# Patient Record
Sex: Male | Born: 1965 | Race: White | Hispanic: No | Marital: Single | Smoking: Never smoker
Health system: Southern US, Community
[De-identification: ages and names within clinical notes are randomized; demographics above are authoritative.]

## PROBLEM LIST (undated history)

## (undated) DIAGNOSIS — E559 Vitamin D deficiency, unspecified: Secondary | ICD-10-CM

## (undated) DIAGNOSIS — I10 Essential (primary) hypertension: Secondary | ICD-10-CM

## (undated) DIAGNOSIS — E119 Type 2 diabetes mellitus without complications: Secondary | ICD-10-CM

## (undated) DIAGNOSIS — N200 Calculus of kidney: Secondary | ICD-10-CM

## (undated) DIAGNOSIS — Z87442 Personal history of urinary calculi: Secondary | ICD-10-CM

## (undated) HISTORY — PX: NO PAST SURGERIES: SHX2092

## (undated) HISTORY — PX: WISDOM TOOTH EXTRACTION: SHX21

---

## 2004-09-22 ENCOUNTER — Encounter: Payer: Self-pay | Admitting: Unknown Physician Specialty

## 2008-03-16 ENCOUNTER — Emergency Department: Payer: Self-pay | Admitting: Emergency Medicine

## 2013-06-08 ENCOUNTER — Emergency Department: Payer: Self-pay | Admitting: Emergency Medicine

## 2013-06-08 LAB — URINALYSIS, COMPLETE
Ketone: NEGATIVE
RBC,UR: 130 /HPF (ref 0–5)
Specific Gravity: 1.017 (ref 1.003–1.030)
Squamous Epithelial: NONE SEEN
WBC UR: 15 /HPF (ref 0–5)

## 2013-06-08 LAB — CBC
HCT: 42.6 % (ref 40.0–52.0)
MCH: 28 pg (ref 26.0–34.0)
MCHC: 32.3 g/dL (ref 32.0–36.0)
MCV: 87 fL (ref 80–100)
Platelet: 261 10*3/uL (ref 150–440)
RDW: 14.5 % (ref 11.5–14.5)

## 2013-06-08 LAB — BASIC METABOLIC PANEL
Anion Gap: 4 — ABNORMAL LOW (ref 7–16)
Calcium, Total: 9.4 mg/dL (ref 8.5–10.1)
Chloride: 103 mmol/L (ref 98–107)
Co2: 29 mmol/L (ref 21–32)
EGFR (Non-African Amer.): >60
Glucose: 189 mg/dL — ABNORMAL HIGH (ref 65–99)
Potassium: 4.5 mmol/L (ref 3.5–5.1)
Sodium: 136 mmol/L (ref 136–145)

## 2017-10-16 DIAGNOSIS — E559 Vitamin D deficiency, unspecified: Secondary | ICD-10-CM | POA: Insufficient documentation

## 2018-02-02 ENCOUNTER — Ambulatory Visit: Payer: BLUE CROSS/BLUE SHIELD | Admitting: Anesthesiology

## 2018-02-02 ENCOUNTER — Other Ambulatory Visit: Payer: Self-pay

## 2018-02-02 ENCOUNTER — Encounter: Payer: Self-pay | Admitting: *Deleted

## 2018-02-02 ENCOUNTER — Ambulatory Visit
Admission: RE | Admit: 2018-02-02 | Discharge: 2018-02-02 | Disposition: A | Discharge status: Home / Self Care | Payer: BLUE CROSS/BLUE SHIELD | Source: Ambulatory Visit | Attending: Unknown Physician Specialty | Admitting: Unknown Physician Specialty

## 2018-02-02 ENCOUNTER — Encounter
Admission: RE | Disposition: A | Discharge status: Home / Self Care | Payer: Self-pay | Source: Ambulatory Visit | Attending: Unknown Physician Specialty

## 2018-02-02 DIAGNOSIS — K648 Other hemorrhoids: Secondary | ICD-10-CM | POA: Insufficient documentation

## 2018-02-02 DIAGNOSIS — Z7984 Long term (current) use of oral hypoglycemic drugs: Secondary | ICD-10-CM | POA: Insufficient documentation

## 2018-02-02 DIAGNOSIS — D127 Benign neoplasm of rectosigmoid junction: Secondary | ICD-10-CM | POA: Insufficient documentation

## 2018-02-02 DIAGNOSIS — Z7982 Long term (current) use of aspirin: Secondary | ICD-10-CM | POA: Diagnosis not present

## 2018-02-02 DIAGNOSIS — Z1211 Encounter for screening for malignant neoplasm of colon: Secondary | ICD-10-CM | POA: Diagnosis present

## 2018-02-02 DIAGNOSIS — E119 Type 2 diabetes mellitus without complications: Secondary | ICD-10-CM | POA: Diagnosis not present

## 2018-02-02 DIAGNOSIS — Z79899 Other long term (current) drug therapy: Secondary | ICD-10-CM | POA: Insufficient documentation

## 2018-02-02 DIAGNOSIS — K635 Polyp of colon: Secondary | ICD-10-CM | POA: Insufficient documentation

## 2018-02-02 HISTORY — PX: COLONOSCOPY WITH PROPOFOL: SHX5780

## 2018-02-02 HISTORY — DX: Type 2 diabetes mellitus without complications: E11.9

## 2018-02-02 HISTORY — DX: Vitamin D deficiency, unspecified: E55.9

## 2018-02-02 HISTORY — DX: Personal history of urinary calculi: Z87.442

## 2018-02-02 HISTORY — DX: Calculus of kidney: N20.0

## 2018-02-02 LAB — GLUCOSE, CAPILLARY: Glucose-Capillary: 185 mg/dL — ABNORMAL HIGH (ref 65–99)

## 2018-02-02 SURGERY — COLONOSCOPY WITH PROPOFOL
Anesthesia: General

## 2018-02-02 MED ORDER — PROPOFOL 10 MG/ML IV BOLUS
INTRAVENOUS | Status: DC | PRN
  Administered 2018-02-02 (×2): 40 mg via INTRAVENOUS
  Administered 2018-02-02: 120 mg via INTRAVENOUS
  Administered 2018-02-02: 40 mg via INTRAVENOUS

## 2018-02-02 MED ORDER — SODIUM CHLORIDE 0.9 % IV SOLN: INTRAVENOUS | Status: DC

## 2018-02-02 MED ORDER — PROPOFOL 500 MG/50ML IV EMUL
INTRAVENOUS | Status: AC
Start: 2018-02-02 — End: 2018-02-02
  Filled 2018-02-02: qty 50

## 2018-02-02 MED ORDER — SODIUM CHLORIDE 0.9 % IV SOLN
INTRAVENOUS | Status: DC
  Administered 2018-02-02: 07:00:00 via INTRAVENOUS

## 2018-02-02 MED ORDER — SUCCINYLCHOLINE CHLORIDE 20 MG/ML IJ SOLN
INTRAMUSCULAR | Status: AC
  Filled 2018-02-02: qty 1

## 2018-02-02 MED ORDER — PROPOFOL 10 MG/ML IV BOLUS
INTRAVENOUS | Status: AC
  Filled 2018-02-02: qty 20

## 2018-02-02 MED ORDER — PHENYLEPHRINE HCL 10 MG/ML IJ SOLN
INTRAMUSCULAR | Status: AC
  Filled 2018-02-02: qty 1

## 2018-02-02 MED ORDER — PROPOFOL 500 MG/50ML IV EMUL
INTRAVENOUS | Status: DC | PRN
  Administered 2018-02-02: 140 ug/kg/min via INTRAVENOUS

## 2018-02-02 NOTE — H&P (Signed)
Primary Care Physician:  Sallee Lange, NP Primary Gastroenterologist:  Dr. Vira Agar  Pre-Procedure History & Physical: HPI:  Chris Kline is a 52 y.o. male is here for an colonoscopy for screening.   Past Medical History:  Diagnosis Date  . Diabetes mellitus without complication ([place])    type  . History of kidney stones   . Nephrolithiasis   . Vitamin D deficiency     Past Surgical History:  Procedure Laterality Date  . NO PAST SURGERIES    . WISDOM TOOTH EXTRACTION      Prior to Admission medications   Medication Sig Start Date End Date Taking? Authorizing Provider  acetaminophen (TYLENOL) 500 MG tablet Take 1,000 mg by mouth every 6 (six) hours as needed.   Yes [provider]  aspirin 81 MG chewable tablet Chew by mouth daily.   Yes [provider]  chlorpheniramine (CHLOR-TRIMETON) 4 MG tablet Take 4 mg by mouth as needed for allergies.   Yes [provider]  metFORMIN (GLUCOPHAGE) 500 MG tablet Take 500 mg by mouth 2 (two) times daily with a meal.   Yes [provider]  naproxen sodium (ALEVE) 220 MG tablet Take 440 mg by mouth 2 (two) times daily as needed.   Yes [provider]  Cholecalciferol 2000 units TABS Take 1 tablet by mouth daily.    [provider]    Allergies as of 02/01/2018 - Review Complete 02/01/2018  Allergen Reaction Noted  . Erythromycin Other (See Comments) 02/01/2018    History reviewed. No pertinent family history.  Social History   Socioeconomic History  . Marital status: Single    Spouse name: Not on file  . Number of children: Not on file  . Years of education: Not on file  . Highest education level: Not on file  Social Needs  . Financial resource strain: Not on file  . Food insecurity - worry: Not on file  . Food insecurity - inability: Not on file  . Transportation needs - medical: Not on file  . Transportation needs - non-medical: Not on file  Occupational  History  . Not on file  Tobacco Use  . Smoking status: Never Smoker  . Smokeless tobacco: Never Used  Substance and Sexual Activity  . Alcohol use: No    Frequency: Never  . Drug use: No  . Sexual activity: Not on file  Other Topics Concern  . Not on file  Social History Narrative  . Not on file    Review of Systems: See HPI, otherwise negative ROS  Physical Exam: BP 131/89   Pulse 82   Temp 97.7 F (36.5 C) (Tympanic)   Resp 16   Ht 6\' 1"  (1.854 m)   Wt 120.2 kg (265 lb)   SpO2 99%   BMI 34.96 kg/m  General:   Alert,  pleasant and cooperative in NAD Head:  Normocephalic and atraumatic. Neck:  Supple; no masses or thyromegaly. Lungs:  Clear throughout to auscultation.    Heart:  Regular rate and rhythm. Abdomen:  Soft, nontender and nondistended. Normal bowel sounds, without guarding, and without rebound.   Neurologic:  Alert and  oriented x4;  grossly normal neurologically.  Impression/Plan: THI SISEMORE is here for an colonoscopy to be performed for screening exam.  Risks, benefits, limitations, and alternatives regarding  colonoscopy have been reviewed with the patient.  Questions have been answered.  All parties agreeable.   Gaylyn Cheers, MD  02/02/2018, 7:30 AM

## 2018-02-02 NOTE — Transfer of Care (Signed)
Immediate Anesthesia Transfer of Care Note  Patient: Chris Kline  Procedure(s) Performed: COLONOSCOPY WITH PROPOFOL (N/A )  Patient Location: Endoscopy Unit  Anesthesia Type:General  Level of Consciousness: sedated  Airway & Oxygen Therapy: Patient Spontanous Breathing and Patient connected to nasal cannula oxygen  Post-op Assessment: Report given to RN and Post -op Vital signs reviewed and stable  Post vital signs: stable  Last Vitals:  Vitals:   02/02/18 0712 02/02/18 0801  BP: 131/89   Pulse: 82   Resp: 16   Temp: 36.5 C (!) 36.1 C  SpO2: 99%     Last Pain:  Vitals:   02/02/18 0801  TempSrc: Tympanic         Complications: No apparent anesthesia complications

## 2018-02-02 NOTE — Anesthesia Postprocedure Evaluation (Signed)
Anesthesia Post Note  Patient: Chris Kline  Procedure(s) Performed: COLONOSCOPY WITH PROPOFOL (N/A )  Patient location during evaluation: Endoscopy Anesthesia Type: General Level of consciousness: awake and alert Pain management: pain level controlled Vital Signs Assessment: post-procedure vital signs reviewed and stable Respiratory status: spontaneous breathing, nonlabored ventilation, respiratory function stable and patient connected to nasal cannula oxygen Cardiovascular status: blood pressure returned to baseline and stable Postop Assessment: no apparent nausea or vomiting Anesthetic complications: no     Last Vitals:  Vitals:   02/02/18 0811 02/02/18 0821  BP: 108/84 123/84  Pulse:    Resp:    Temp:    SpO2:      Last Pain:  Vitals:   02/02/18 0801  TempSrc: Tympanic  PainSc: Asleep                 Precious Haws Piscitello

## 2018-02-02 NOTE — Op Note (Signed)
[facility] Gastroenterology Patient Name: Chris Kline Procedure Date: 02/02/2018 7:33 AM MRN: [id number] Account #: [id number] Date of Birth: [birth year] Admit Type: Outpatient Age: 52 Room: [facility] ENDO ROOM 4 Gender: Male Note Status: Finalized Procedure:            Colonoscopy Indications:          Screening for colorectal malignant neoplasm Providers:            Manya Silvas, MD Referring MD:         Juluis Rainier (Referring MD) Medicines:            Propofol per Anesthesia Complications:        No immediate complications. Procedure:            Pre-Anesthesia Assessment:                       - After reviewing the risks and benefits, the patient                        was deemed in satisfactory condition to undergo the                        procedure.                       After obtaining informed consent, the colonoscope was                        passed under direct vision. Throughout the procedure,                        the patient's blood pressure, pulse, and oxygen                        saturations were monitored continuously. The                        Colonoscope was introduced through the anus and                        advanced to the the cecum, identified by appendiceal                        orifice and ileocecal valve. The colonoscopy was                        performed without difficulty. The patient tolerated the                        procedure well. The quality of the bowel preparation                        was excellent. Findings:      Two sessile polyps were found in the rectum. The polyps were diminutive       in size. These polyps were removed with a jumbo cold forceps. Resection       and retrieval were complete.      Internal hemorrhoids were found during endoscopy. The hemorrhoids were       medium-sized and Grade I (internal hemorrhoids that do not prolapse).      The exam  was otherwise without abnormality. Impression:            - Two diminutive polyps in the rectum, removed with a                        jumbo cold forceps. Resected and retrieved.                       - Internal hemorrhoids.                       - The examination was otherwise normal. Recommendation:       - Await pathology results. Manya Silvas, MD 02/02/2018 8:01:02 AM This report has been signed electronically. Number of Addenda: 0 Note Initiated On: 02/02/2018 7:33 AM Scope Withdrawal Time: 0 hours 11 minutes 9 seconds  Total Procedure Duration: 0 hours 17 minutes 55 seconds       [facility]

## 2018-02-02 NOTE — Anesthesia Post-op Follow-up Note (Signed)
Anesthesia QCDR form completed.        

## 2018-02-02 NOTE — Anesthesia Preprocedure Evaluation (Signed)
Anesthesia Evaluation  Patient identified by MRN, date of birth, ID band Patient awake    Reviewed: Allergy & Precautions, H&P , NPO status , Patient's Chart, lab work & pertinent test results  History of Anesthesia Complications Negative for: history of anesthetic complications  Airway Mallampati: III  TM Distance: <3 FB Neck ROM: full    Dental  (+) Chipped   Pulmonary neg shortness of breath,           Cardiovascular Exercise Tolerance: Good (-) angina(-) Past MI and (-) DOE negative cardio ROS       Neuro/Psych negative neurological ROS  negative psych ROS   GI/Hepatic negative GI ROS, Neg liver ROS, neg GERD  ,  Endo/Other  diabetes  Renal/GU Renal disease  negative genitourinary   Musculoskeletal   Abdominal   Peds  Hematology negative hematology ROS (+)   Anesthesia Other Findings Signs and symptoms suggestive of sleep apnea   Past Medical History: No date: Diabetes mellitus without complication ([facility])     Comment:  type No date: History of kidney stones No date: Nephrolithiasis No date: Vitamin D deficiency  Past Surgical History: No date: NO PAST SURGERIES No date: WISDOM TOOTH EXTRACTION  BMI    Body Mass Index:  34.96 kg/m      Reproductive/Obstetrics negative OB ROS                             Anesthesia Physical Anesthesia Plan  ASA: III  Anesthesia Plan: General   Post-op Pain Management:    Induction: Intravenous  PONV Risk Score and Plan: Propofol infusion and TIVA  Airway Management Planned: Natural Airway and Nasal Cannula  Additional Equipment:   Intra-op Plan:   Post-operative Plan:   Informed Consent: I have reviewed the patients History and Physical, chart, labs and discussed the procedure including the risks, benefits and alternatives for the proposed anesthesia with the patient or authorized representative who has indicated his/her  understanding and acceptance.   Dental Advisory Given  Plan Discussed with: Anesthesiologist, CRNA and Surgeon  Anesthesia Plan Comments: (Patient consented for risks of anesthesia including but not limited to:  - adverse reactions to medications - risk of intubation if required - damage to teeth, lips or other oral mucosa - sore throat or hoarseness - Damage to heart, brain, lungs or loss of life  Patient voiced understanding.)        Anesthesia Quick Evaluation

## 2018-02-03 LAB — SURGICAL PATHOLOGY

## 2018-02-04 ENCOUNTER — Encounter: Payer: Self-pay | Admitting: Unknown Physician Specialty

## 2018-02-28 ENCOUNTER — Emergency Department
Admission: EM | Admit: 2018-02-28 | Discharge: 2018-02-28 | Disposition: A | Discharge status: Home / Self Care | Payer: BLUE CROSS/BLUE SHIELD | Attending: Emergency Medicine | Admitting: Emergency Medicine

## 2018-02-28 ENCOUNTER — Encounter: Payer: Self-pay | Admitting: Emergency Medicine

## 2018-02-28 DIAGNOSIS — I10 Essential (primary) hypertension: Secondary | ICD-10-CM | POA: Diagnosis present

## 2018-02-28 DIAGNOSIS — Z7982 Long term (current) use of aspirin: Secondary | ICD-10-CM | POA: Diagnosis not present

## 2018-02-28 DIAGNOSIS — R253 Fasciculation: Secondary | ICD-10-CM | POA: Diagnosis not present

## 2018-02-28 DIAGNOSIS — M542 Cervicalgia: Secondary | ICD-10-CM | POA: Insufficient documentation

## 2018-02-28 DIAGNOSIS — Z7984 Long term (current) use of oral hypoglycemic drugs: Secondary | ICD-10-CM | POA: Diagnosis not present

## 2018-02-28 DIAGNOSIS — E119 Type 2 diabetes mellitus without complications: Secondary | ICD-10-CM | POA: Insufficient documentation

## 2018-02-28 HISTORY — DX: Essential (primary) hypertension: I10

## 2018-02-28 LAB — COMPREHENSIVE METABOLIC PANEL
ALK PHOS: 73 U/L (ref 38–126)
ALT: 38 U/L (ref 17–63)
AST: 37 U/L (ref 15–41)
Albumin: 4.6 g/dL (ref 3.5–5.0)
Anion gap: 9 (ref 5–15)
BUN: 10 mg/dL (ref 6–20)
CO2: 28 mmol/L (ref 22–32)
Calcium: 10 mg/dL (ref 8.9–10.3)
Chloride: 100 mmol/L — ABNORMAL LOW (ref 101–111)
Creatinine, Ser: 0.88 mg/dL (ref 0.61–1.24)
GFR calc Af Amer: >60 mL/min (ref >60)
Glucose, Bld: 136 mg/dL — ABNORMAL HIGH (ref 65–99)
Potassium: 5 mmol/L (ref 3.5–5.1)
Sodium: 137 mmol/L (ref 135–145)
Total Bilirubin: 0.6 mg/dL (ref 0.3–1.2)
Total Protein: 8.5 g/dL — ABNORMAL HIGH (ref 6.5–8.1)

## 2018-02-28 LAB — CBC WITH DIFFERENTIAL/PLATELET
BASOS ABS: 0 10*3/uL (ref 0–0.1)
Basophils Relative: 0 %
EOS ABS: 0.2 10*3/uL (ref 0–0.7)
EOS PCT: 1 %
HCT: 46.6 % (ref 40.0–52.0)
Hemoglobin: 16 g/dL (ref 13.0–18.0)
Lymphocytes Relative: 22 %
Lymphs Abs: 2.3 10*3/uL (ref 1.0–3.6)
MCH: 28.8 pg (ref 26.0–34.0)
MCHC: 34.3 g/dL (ref 32.0–36.0)
MCV: 84 fL (ref 80.0–100.0)
Monocytes Absolute: 0.8 10*3/uL (ref 0.2–1.0)
Monocytes Relative: 8 %
Neutro Abs: 7.1 10*3/uL — ABNORMAL HIGH (ref 1.4–6.5)
Neutrophils Relative %: 69 %
PLATELETS: 304 10*3/uL (ref 150–440)
RBC: 5.55 MIL/uL (ref 4.40–5.90)
RDW: 14 % (ref 11.5–14.5)
WBC: 10.4 10*3/uL (ref 3.8–10.6)

## 2018-02-28 LAB — TROPONIN I

## 2018-02-28 LAB — TSH: TSH: 2.992 u[IU]/mL (ref 0.350–4.500)

## 2018-02-28 NOTE — ED Triage Notes (Signed)
Pt reports his MD just put him on a blood pressure medication at the end of April. Pt takes 5mg  of lisinopril. Pt reports he could not get his md to answer the office so he came here. Pt reports the bottom number has been high for the past 4 days and he has been hyper. Denies SOB, other sx's. Pt also reports she increased his dose of diabetes medication and he thinks maybe that is making him hyper.

## 2018-02-28 NOTE — ED Notes (Signed)
VS rechecked.  BP 159/92 large cuff sitting.  Wait explained to patient.

## 2018-02-28 NOTE — Discharge Instructions (Signed)
Follow-up with your regular doctor if not better in 3 days.  Continue to take your medications as prescribed.

## 2018-02-28 NOTE — ED Notes (Signed)
See triage note  States he was recently placed on a larger dose of Metformin and  Lisinopril   States over the past 4 days he has woke up shaky and felt like his heart was beating in his head

## 2018-02-28 NOTE — ED Provider Notes (Signed)
[facility] Emergency Department Provider Note  ____________________________________________   First MD Initiated Contact with Patient 02/28/18 1345     (approximate)  I have reviewed the triage vital signs and the nursing notes.   HISTORY  Chief Complaint Hypertension    HPI Chris Kline is a 52 y.o. male presents emergency department with concerns of having a heart attack or stroke.  He states he was recently placed on a blood pressure medicine.  Lisinopril 5 mg daily.  He also had been started on metformin 500 mg twice daily.  His doctor recently increased his dose of metformin to 1000 mg twice daily.  He states the last metformin he took did not have HCL behind it.  He states he is waking up and unable to get back to sleep.  He feels hyper.  Jittery.  He has had some posterior neck pain with radiation into the left arm.  He states his family history is strong for MIs and strokes and this got him highly concerned.  He denies chest pain at this time.  He denies shortness of breath.  He denies any diaphoresis.  He states the pain is not there anymore.  Past Medical History:  Diagnosis Date  . Diabetes mellitus without complication ([place])    type  . History of kidney stones   . Hypertension   . Nephrolithiasis   . Vitamin D deficiency     There are no active problems to display for this patient.   Past Surgical History:  Procedure Laterality Date  . COLONOSCOPY WITH PROPOFOL N/A 02/02/2018   Procedure: COLONOSCOPY WITH PROPOFOL;  Surgeon: Manya Silvas, MD;  Location: [facility] ENDOSCOPY;  Service: Endoscopy;  Laterality: N/A;  . NO PAST SURGERIES    . WISDOM TOOTH EXTRACTION      Prior to Admission medications   Medication Sig Start Date End Date Taking? Authorizing Provider  acetaminophen (TYLENOL) 500 MG tablet Take 1,000 mg by mouth every 6 (six) hours as needed.    [provider]  aspirin 81 MG chewable tablet Chew by mouth daily.     [provider]  chlorpheniramine (CHLOR-TRIMETON) 4 MG tablet Take 4 mg by mouth as needed for allergies.    [provider]  Cholecalciferol 2000 units TABS Take 1 tablet by mouth daily.    [provider]  metFORMIN (GLUCOPHAGE) 500 MG tablet Take 500 mg by mouth 2 (two) times daily with a meal.    [provider]  naproxen sodium (ALEVE) 220 MG tablet Take 440 mg by mouth 2 (two) times daily as needed.    [provider]    Allergies Erythromycin  No family history on file.  Social History Social History   Tobacco Use  . Smoking status: Never Smoker  . Smokeless tobacco: Never Used  Substance Use Topics  . Alcohol use: No    Frequency: Never  . Drug use: No    Review of Systems  Constitutional: No fever/chills, states that he feels hyper and jittery Eyes: No visual changes. ENT: No sore throat. Respiratory: Denies cough Cardiovascular: Denies chest pain Genitourinary: Negative for dysuria. Musculoskeletal: Negative for back pain.  Positive for left arm pain Skin: Negative for rash.    ____________________________________________   PHYSICAL EXAM:  VITAL SIGNS: ED Triage Vitals  Enc Vitals Group     BP 02/28/18 0930 (!) 178/101     Pulse Rate 02/28/18 0930 89     Resp 02/28/18 0930 20  Temp 02/28/18 0930 98.1 F (36.7 C)     Temp Source 02/28/18 0930 Oral     SpO2 02/28/18 0930 100 %     Weight 02/28/18 0931 265 lb (120.2 kg)     Height 02/28/18 0931 6' (1.829 m)     Head Circumference --      Peak Flow --      Pain Score 02/28/18 0931 0     Pain Loc --      Pain Edu? --      Excl. in [place]? --     Constitutional: Alert and oriented. Well appearing and in no acute distress. Eyes: Conjunctivae are normal.  Head: Atraumatic. Nose: No congestion/rhinnorhea. Mouth/Throat: Mucous membranes are moist.   Neck: Is supple, no lymphadenopathy is noted, C-spine is nontender Cardiovascular: Normal rate, regular  rhythm.  Heart sounds are normal, no murmur rub or gallop is noted.  No bruits are noted Respiratory: Normal respiratory effort.  No retractions lungs are clear to auscultation GU: deferred Musculoskeletal: FROM all extremities, warm and well perfused Neurologic:  Normal speech and language.  Skin:  Skin is warm, dry and intact. No rash noted. Psychiatric: Mood and affect are normal. Speech and behavior are normal.  ____________________________________________   LABS (all labs ordered are listed, but only abnormal results are displayed)  Labs Reviewed  COMPREHENSIVE METABOLIC PANEL - Abnormal; Notable for the following components:      Result Value   Chloride 100 (*)    Glucose, Bld 136 (*)    Total Protein 8.5 (*)    All other components within normal limits  CBC WITH DIFFERENTIAL/PLATELET - Abnormal; Notable for the following components:   Neutro Abs 7.1 (*)    All other components within normal limits  TROPONIN I  TSH   ____________________________________________   ____________________________________________  RADIOLOGY    ____________________________________________   PROCEDURES  Procedure(s) performed: EKG  Procedures    ____________________________________________   INITIAL IMPRESSION / ASSESSMENT AND PLAN / ED COURSE  Pertinent labs & imaging results that were available during my care of the patient were reviewed by me and considered in my medical decision making (see chart for details).  Patient is a 52 year old male presents emergency department with concerns of suddenly becoming jittery with some posterior neck pain and left arm pain.  He is concerned he is having an MI or stroke.  Denies any slurred speech or stroke symptoms.  He has a strong family history of the above.  On physical exam he appears very well.  Lungs are clear to auscultation heart sounds are normal EKG is normal sinus rhythm with no STEMI noted.  Labs were obtained.  Lab results  show negative troponin, CBC and met C are also normal.  Test results were explained to the patient.  Explained that it could be the binding component and the new dose of metformin that is making him feel odd.  He should follow-up with his regular doctor to discuss this medication.  He may need a increase in his lisinopril as his blood pressure is still elevated here in the ED.  He states he understands he will follow-up with his doctor.  He had called her earlier today and was unable to get through with their phones were down.  He was discharged in stable condition     As part of my medical decision making, I reviewed the following data within the [place] notes reviewed and incorporated, Labs reviewed as noted above  they are normal, EKG interpreted NSR, EKG was read by Dr. Clearnce Hasten and states no STEMI, Old chart reviewed, Notes from prior ED visits and [place] Controlled Substance Database  ____________________________________________   FINAL CLINICAL IMPRESSION(S) / ED DIAGNOSES  Final diagnoses:  Essential hypertension  Muscle twitching      NEW MEDICATIONS STARTED DURING THIS VISIT:  Discharge Medication List as of 02/28/2018  3:27 PM       Note:  This document was prepared using Dragon voice recognition software and may include unintentional dictation errors.    Versie Starks, PA-C 02/28/18 1849    Clearnce Hasten Randall An, MD 03/01/18 [phone]

## 2018-02-28 NOTE — ED Triage Notes (Signed)
Pt reports the phones were down in [place] and he was not able to reach his doctor.

## 2019-03-02 DIAGNOSIS — Z5320 Procedure and treatment not carried out because of patient's decision for unspecified reasons: Secondary | ICD-10-CM | POA: Insufficient documentation

## 2019-03-02 DIAGNOSIS — Z5329 Procedure and treatment not carried out because of patient's decision for other reasons: Secondary | ICD-10-CM | POA: Insufficient documentation

## 2020-08-27 ENCOUNTER — Encounter: Payer: Self-pay | Admitting: Podiatry

## 2020-08-27 ENCOUNTER — Emergency Department: Payer: BC Managed Care – PPO

## 2020-08-27 ENCOUNTER — Inpatient Hospital Stay
Admission: EM | Admit: 2020-08-27 | Discharge: 2020-08-30 | DRG: 872 | Disposition: A | Discharge status: Home Health | Payer: BC Managed Care – PPO | Attending: Internal Medicine | Admitting: Internal Medicine

## 2020-08-27 ENCOUNTER — Other Ambulatory Visit: Payer: Self-pay

## 2020-08-27 ENCOUNTER — Other Ambulatory Visit
Admission: RE | Admit: 2020-08-27 | Discharge: 2020-08-27 | Disposition: A | Discharge status: Home / Self Care | Payer: BC Managed Care – PPO | Source: Ambulatory Visit | Attending: Podiatry | Admitting: Podiatry

## 2020-08-27 ENCOUNTER — Inpatient Hospital Stay: Payer: BC Managed Care – PPO

## 2020-08-27 DIAGNOSIS — Z881 Allergy status to other antibiotic agents status: Secondary | ICD-10-CM | POA: Diagnosis not present

## 2020-08-27 DIAGNOSIS — E11621 Type 2 diabetes mellitus with foot ulcer: Secondary | ICD-10-CM | POA: Diagnosis present

## 2020-08-27 DIAGNOSIS — E1165 Type 2 diabetes mellitus with hyperglycemia: Secondary | ICD-10-CM | POA: Diagnosis present

## 2020-08-27 DIAGNOSIS — N179 Acute kidney failure, unspecified: Secondary | ICD-10-CM | POA: Diagnosis present

## 2020-08-27 DIAGNOSIS — Z7984 Long term (current) use of oral hypoglycemic drugs: Secondary | ICD-10-CM | POA: Diagnosis not present

## 2020-08-27 DIAGNOSIS — Z79899 Other long term (current) drug therapy: Secondary | ICD-10-CM

## 2020-08-27 DIAGNOSIS — R739 Hyperglycemia, unspecified: Secondary | ICD-10-CM

## 2020-08-27 DIAGNOSIS — L97529 Non-pressure chronic ulcer of other part of left foot with unspecified severity: Secondary | ICD-10-CM | POA: Diagnosis present

## 2020-08-27 DIAGNOSIS — L089 Local infection of the skin and subcutaneous tissue, unspecified: Secondary | ICD-10-CM | POA: Diagnosis not present

## 2020-08-27 DIAGNOSIS — I10 Essential (primary) hypertension: Secondary | ICD-10-CM | POA: Diagnosis present

## 2020-08-27 DIAGNOSIS — L02612 Cutaneous abscess of left foot: Secondary | ICD-10-CM | POA: Diagnosis present

## 2020-08-27 DIAGNOSIS — Z833 Family history of diabetes mellitus: Secondary | ICD-10-CM

## 2020-08-27 DIAGNOSIS — E119 Type 2 diabetes mellitus without complications: Secondary | ICD-10-CM | POA: Diagnosis not present

## 2020-08-27 DIAGNOSIS — E872 Acidosis: Secondary | ICD-10-CM | POA: Diagnosis present

## 2020-08-27 DIAGNOSIS — A419 Sepsis, unspecified organism: Secondary | ICD-10-CM | POA: Diagnosis present

## 2020-08-27 DIAGNOSIS — M009 Pyogenic arthritis, unspecified: Secondary | ICD-10-CM | POA: Diagnosis present

## 2020-08-27 DIAGNOSIS — Z8249 Family history of ischemic heart disease and other diseases of the circulatory system: Secondary | ICD-10-CM | POA: Diagnosis not present

## 2020-08-27 DIAGNOSIS — Z20822 Contact with and (suspected) exposure to covid-19: Secondary | ICD-10-CM | POA: Diagnosis present

## 2020-08-27 DIAGNOSIS — Z7982 Long term (current) use of aspirin: Secondary | ICD-10-CM | POA: Diagnosis not present

## 2020-08-27 DIAGNOSIS — Z87442 Personal history of urinary calculi: Secondary | ICD-10-CM | POA: Diagnosis not present

## 2020-08-27 DIAGNOSIS — L97509 Non-pressure chronic ulcer of other part of unspecified foot with unspecified severity: Secondary | ICD-10-CM | POA: Diagnosis present

## 2020-08-27 DIAGNOSIS — M795 Residual foreign body in soft tissue: Secondary | ICD-10-CM

## 2020-08-27 LAB — CBC WITH DIFFERENTIAL/PLATELET
Abs Immature Granulocytes: 0.04 10*3/uL (ref 0.00–0.07)
Basophils Absolute: 0 10*3/uL (ref 0.0–0.1)
Basophils Relative: 0 %
Eosinophils Absolute: 0.1 10*3/uL (ref 0.0–0.5)
Eosinophils Relative: 1 %
HCT: 40 % (ref 39.0–52.0)
Hemoglobin: 13.5 g/dL (ref 13.0–17.0)
Immature Granulocytes: 0 %
Lymphocytes Relative: 11 %
Lymphs Abs: 1.4 10*3/uL (ref 0.7–4.0)
MCH: 29.2 pg (ref 26.0–34.0)
MCHC: 33.8 g/dL (ref 30.0–36.0)
MCV: 86.4 fL (ref 80.0–100.0)
Monocytes Absolute: 0.9 10*3/uL (ref 0.1–1.0)
Monocytes Relative: 7 %
Neutro Abs: 10.7 10*3/uL — ABNORMAL HIGH (ref 1.7–7.7)
Neutrophils Relative %: 81 %
Platelets: 281 10*3/uL (ref 150–400)
RBC: 4.63 MIL/uL (ref 4.22–5.81)
RDW: 12.7 % (ref 11.5–15.5)
WBC: 13.2 10*3/uL — ABNORMAL HIGH (ref 4.0–10.5)
nRBC: 0 % (ref 0.0–0.2)

## 2020-08-27 LAB — COMPREHENSIVE METABOLIC PANEL
ALT: 20 U/L (ref 0–44)
AST: 17 U/L (ref 15–41)
Albumin: 4.3 g/dL (ref 3.5–5.0)
Alkaline Phosphatase: 49 U/L (ref 38–126)
Anion gap: 13 (ref 5–15)
BUN: 23 mg/dL — ABNORMAL HIGH (ref 6–20)
CO2: 21 mmol/L — ABNORMAL LOW (ref 22–32)
Calcium: 9.6 mg/dL (ref 8.9–10.3)
Chloride: 99 mmol/L (ref 98–111)
Creatinine, Ser: 1.48 mg/dL — ABNORMAL HIGH (ref 0.61–1.24)
GFR, Estimated: 53 mL/min — ABNORMAL LOW (ref >60)
Glucose, Bld: 232 mg/dL — ABNORMAL HIGH (ref 70–99)
Potassium: 4.5 mmol/L (ref 3.5–5.1)
Sodium: 133 mmol/L — ABNORMAL LOW (ref 135–145)
Total Bilirubin: 1 mg/dL (ref 0.3–1.2)
Total Protein: 8.5 g/dL — ABNORMAL HIGH (ref 6.5–8.1)

## 2020-08-27 LAB — HEMOGLOBIN A1C
Hgb A1c MFr Bld: 6.7 % — ABNORMAL HIGH (ref 4.8–5.6)
Mean Plasma Glucose: 145.59 mg/dL

## 2020-08-27 LAB — LACTIC ACID, PLASMA
Lactic Acid, Venous: 2.8 mmol/L (ref 0.5–1.9)
Lactic Acid, Venous: 3 mmol/L (ref 0.5–1.9)
Lactic Acid, Venous: 2.7 mmol/L (ref 0.5–1.9)

## 2020-08-27 LAB — AEROBIC CULTURE  (SUPERFICIAL SPECIMEN)

## 2020-08-27 LAB — PROTIME-INR
INR: 1.1 (ref 0.8–1.2)
Prothrombin Time: 13.8 seconds (ref 11.4–15.2)

## 2020-08-27 LAB — PROCALCITONIN: Procalcitonin: <0.1 ng/mL

## 2020-08-27 LAB — HIV ANTIBODY (ROUTINE TESTING W REFLEX): HIV Screen 4th Generation wRfx: NONREACTIVE

## 2020-08-27 LAB — SEDIMENTATION RATE: Sed Rate: 58 mm/hr — ABNORMAL HIGH (ref 0–20)

## 2020-08-27 LAB — C-REACTIVE PROTEIN: CRP: 17.8 mg/dL — ABNORMAL HIGH (ref <1.0)

## 2020-08-27 LAB — RESPIRATORY PANEL BY RT PCR (FLU A&B, COVID)
Influenza A by PCR: NEGATIVE
Influenza B by PCR: NEGATIVE
SARS Coronavirus 2 by RT PCR: NEGATIVE

## 2020-08-27 LAB — GLUCOSE, CAPILLARY
Glucose-Capillary: 149 mg/dL — ABNORMAL HIGH (ref 70–99)
Glucose-Capillary: 160 mg/dL — ABNORMAL HIGH (ref 70–99)

## 2020-08-27 LAB — APTT: aPTT: 27 seconds (ref 24–36)

## 2020-08-27 MED ORDER — HEPARIN SODIUM (PORCINE) 5000 UNIT/ML IJ SOLN
5000.0000 [IU] | Freq: Three times a day (TID) | INTRAMUSCULAR | Status: DC
  Administered 2020-08-27 – 2020-08-29 (×5): 5000 [IU] via SUBCUTANEOUS
  Filled 2020-08-27 (×5): qty 1

## 2020-08-27 MED ORDER — SODIUM CHLORIDE 0.9 % IV BOLUS: 3000.0000 mL | Freq: Once | INTRAVENOUS | Status: DC

## 2020-08-27 MED ORDER — SODIUM CHLORIDE 0.9 % IV SOLN: INTRAVENOUS | Status: DC

## 2020-08-27 MED ORDER — INSULIN ASPART 100 UNIT/ML ~~LOC~~ SOLN
0.0000 [IU] | Freq: Every day | SUBCUTANEOUS | Status: DC
  Administered 2020-08-28: 3 [IU] via SUBCUTANEOUS
  Administered 2020-08-29: 2 [IU] via SUBCUTANEOUS
  Filled 2020-08-27 (×2): qty 1

## 2020-08-27 MED ORDER — ACETAMINOPHEN 325 MG PO TABS: 650.0000 mg | ORAL_TABLET | Freq: Four times a day (QID) | ORAL | Status: DC | PRN

## 2020-08-27 MED ORDER — VANCOMYCIN HCL IN DEXTROSE 1-5 GM/200ML-% IV SOLN
1000.0000 mg | Freq: Two times a day (BID) | INTRAVENOUS | Status: DC
  Administered 2020-08-28: 1000 mg via INTRAVENOUS
  Filled 2020-08-27 (×2): qty 200

## 2020-08-27 MED ORDER — HYDRALAZINE HCL 20 MG/ML IJ SOLN: 5.0000 mg | INTRAMUSCULAR | Status: DC | PRN

## 2020-08-27 MED ORDER — SODIUM CHLORIDE 0.9 % IV BOLUS
1000.0000 mL | Freq: Once | INTRAVENOUS | Status: AC
  Administered 2020-08-27: 1000 mL via INTRAVENOUS

## 2020-08-27 MED ORDER — INSULIN ASPART 100 UNIT/ML ~~LOC~~ SOLN
0.0000 [IU] | Freq: Three times a day (TID) | SUBCUTANEOUS | Status: DC
  Administered 2020-08-29: 3 [IU] via SUBCUTANEOUS
  Administered 2020-08-29: 2 [IU] via SUBCUTANEOUS
  Administered 2020-08-29: 3 [IU] via SUBCUTANEOUS
  Administered 2020-08-30: 2 [IU] via SUBCUTANEOUS
  Filled 2020-08-27 (×5): qty 1

## 2020-08-27 MED ORDER — SODIUM CHLORIDE 0.9 % IV SOLN
2.0000 g | INTRAVENOUS | Status: DC
  Administered 2020-08-28 – 2020-08-29 (×2): 2 g via INTRAVENOUS
  Filled 2020-08-27 (×3): qty 20

## 2020-08-27 MED ORDER — VANCOMYCIN HCL IN DEXTROSE 1-5 GM/200ML-% IV SOLN: 1000.0000 mg | Freq: Once | INTRAVENOUS | Status: DC

## 2020-08-27 MED ORDER — VANCOMYCIN HCL IN DEXTROSE 1-5 GM/200ML-% IV SOLN
1000.0000 mg | Freq: Once | INTRAVENOUS | Status: AC
  Administered 2020-08-27: 1000 mg via INTRAVENOUS
  Filled 2020-08-27: qty 200

## 2020-08-27 MED ORDER — SODIUM CHLORIDE 0.9 % IV SOLN: 1.0000 g | INTRAVENOUS | Status: DC

## 2020-08-27 MED ORDER — ONDANSETRON HCL 4 MG/2ML IJ SOLN: 4.0000 mg | Freq: Three times a day (TID) | INTRAMUSCULAR | Status: DC | PRN

## 2020-08-27 MED ORDER — SENNOSIDES-DOCUSATE SODIUM 8.6-50 MG PO TABS: 1.0000 | ORAL_TABLET | Freq: Every evening | ORAL | Status: DC | PRN

## 2020-08-27 MED ORDER — SODIUM CHLORIDE 0.9 % IV SOLN
2.0000 g | Freq: Once | INTRAVENOUS | Status: AC
  Administered 2020-08-27: 2 g via INTRAVENOUS
  Filled 2020-08-27: qty 20

## 2020-08-27 MED ORDER — VANCOMYCIN HCL 1500 MG/300ML IV SOLN
1500.0000 mg | Freq: Once | INTRAVENOUS | Status: AC
  Administered 2020-08-27: 1500 mg via INTRAVENOUS
  Filled 2020-08-27 (×2): qty 300

## 2020-08-27 MED ORDER — GADOBUTROL 1 MMOL/ML IV SOLN
10.0000 mL | Freq: Once | INTRAVENOUS | Status: AC | PRN
  Administered 2020-08-27: 10 mL via INTRAVENOUS

## 2020-08-27 MED ORDER — VITAMIN D 25 MCG (1000 UNIT) PO TABS
2000.0000 [IU] | ORAL_TABLET | Freq: Every day | ORAL | Status: DC
  Administered 2020-08-29 – 2020-08-30 (×2): 2000 [IU] via ORAL
  Filled 2020-08-27 (×2): qty 2

## 2020-08-27 MED ORDER — INSULIN ASPART 100 UNIT/ML ~~LOC~~ SOLN
0.0000 [IU] | SUBCUTANEOUS | Status: DC
  Filled 2020-08-27: qty 1

## 2020-08-27 MED ORDER — LACTATED RINGERS IV BOLUS
30.0000 mL/kg | Freq: Once | INTRAVENOUS | Status: AC
  Administered 2020-08-27: 2328 mL via INTRAVENOUS

## 2020-08-27 NOTE — ED Notes (Signed)
Report called to nicole rn floor nurse

## 2020-08-27 NOTE — Consult Note (Addendum)
[facility] Nurse Consult Note: Reason for Consult: Consult requested for left foot wound.  Performed remotely after review of the progress notes and photo in the EMR.  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 Dressing procedure/placement/frequency: Topical treatment orders provided for bedside nurses to perform as follows until further recommendations are available from the podiatry team: Apply moist fluffed gauze to left foot wound Q day, then cover with foam dressing.   Podiatry consult has been requested and is pending.  MRI has been ordered.  Please refer to podiatry team for further plan of care.  Please re-consult if further assistance is needed.  Thank-you,  Julien Girt MSN, [place], [place], [place], [place]

## 2020-08-27 NOTE — Consult Note (Signed)
ORTHOPAEDIC CONSULTATION  REQUESTING PHYSICIAN: Ivor Costa, MD  Chief Complaint: Left foot infection  HPI: Chris Kline is a 54 y.o. male who complains of worsening left foot infection.  2 to 3 weeks ago he was working in his yard and some gravel was called in his boot.  He developed a blister under his left great toe joint.  He has noticed worsening redness and swelling.  Was seen in the outpatient clinic today by Dr. Sharlotte Alamo.  The wound probed with obvious purulent drainage.  He had noted surrounding redness with a lymphangitic streak into his foot.  Admitted to the hospital for IV antibiotics and likely debridement of abscess.  Past Medical History:  Diagnosis Date  . Diabetes mellitus without complication ([place])    type  . History of kidney stones   . Hypertension   . Nephrolithiasis   . Vitamin D deficiency    Past Surgical History:  Procedure Laterality Date  . COLONOSCOPY WITH PROPOFOL N/A 02/02/2018   Procedure: COLONOSCOPY WITH PROPOFOL;  Surgeon: Manya Silvas, MD;  Location: [facility] ENDOSCOPY;  Service: Endoscopy;  Laterality: N/A;  . NO PAST SURGERIES    . WISDOM TOOTH EXTRACTION     Social History   Socioeconomic History  . Marital status: Single    Spouse name: Not on file  . Number of children: Not on file  . Years of education: Not on file  . Highest education level: Not on file  Occupational History  . Not on file  Tobacco Use  . Smoking status: Never Smoker  . Smokeless tobacco: Never Used  Vaping Use  . Vaping Use: Never used  Substance and Sexual Activity  . Alcohol use: No  . Drug use: No  . Sexual activity: Not on file  Other Topics Concern  . Not on file  Social History Narrative  . Not on file   Social Determinants of Health   Financial Resource Strain:   . Difficulty of Paying Living Expenses: Not on file  Food Insecurity:   . Worried About Charity fundraiser in the Last Year: Not on file  . Ran Out of Food in the Last Year:  Not on file  Transportation Needs:   . Lack of Transportation (Medical): Not on file  . Lack of Transportation (Non-Medical): Not on file  Physical Activity:   . Days of Exercise per Week: Not on file  . Minutes of Exercise per Session: Not on file  Stress:   . Feeling of Stress : Not on file  Social Connections:   . Frequency of Communication with Friends and Family: Not on file  . Frequency of Social Gatherings with Friends and Family: Not on file  . Attends Religious Services: Not on file  . Active Member of Clubs or Organizations: Not on file  . Attends Archivist Meetings: Not on file  . Marital Status: Not on file   Family History  Problem Relation Age of Onset  . Heart attack Mother   . Diabetes Mellitus II Father   . Stroke Father   . Heart attack Father   . Lung cancer Sister    Allergies  Allergen Reactions  . Erythromycin Other (See Comments)    Abdominal pain   Prior to Admission medications   Medication Sig Start Date End Date Taking? Authorizing Provider  Cholecalciferol 2000 units TABS Take 1 tablet by mouth daily.   Yes [provider]  lisinopril-hydrochlorothiazide (ZESTORETIC) 20-25 MG tablet Take  1 tablet by mouth daily. 08/12/20  Yes [provider]  metFORMIN (GLUCOPHAGE) 500 MG tablet Take 500 mg by mouth 2 (two) times daily with a meal.   Yes [provider]   DG Eye Foreign Body  Result Date: 08/27/2020 CLINICAL DATA:  Metal working/exposure; clearance prior to MRI EXAM: ORBITS FOR FOREIGN BODY - 2 VIEW COMPARISON:  None. FINDINGS: There is no evidence of metallic foreign body within the orbits. No significant bone abnormality identified. IMPRESSION: No evidence of metallic foreign body within the orbits. Electronically Signed   By: Franchot Gallo M.D.   On: 08/27/2020 13:24   MR FOOT LEFT W WO CONTRAST  Result Date: 08/27/2020 CLINICAL DATA:  Diabetic with foot swelling and enlarging plantar foot wound for 3  weeks. The wound was drained in the clinic. Clinical concern for osteomyelitis. EXAM: MRI OF THE LEFT FOREFOOT WITHOUT AND WITH CONTRAST TECHNIQUE: Multiplanar, multisequence MR imaging of the left forefoot was performed both before and after administration of intravenous contrast. CONTRAST:  78mL GADAVIST GADOBUTROL 1 MMOL/ML IV SOLN COMPARISON:  None. FINDINGS: Despite efforts by the technologist and patient, mild motion artifact is present on today's exam and could not be eliminated. This reduces exam sensitivity and specificity. Bones/Joint/Cartilage No evidence of acute fracture, dislocation or osteomyelitis. Specifically, no suspicious marrow signal, abnormal enhancement or cortical destruction. Mild degenerative changes within the sesamoids of the 1st metatarsal. No significant joint effusions or suspicious synovial enhancement. Ligaments The Lisfranc ligament is intact. The collateral ligaments of the metatarsophalangeal joints are intact. Muscles and Tendons Intact forefoot tendons without significant tenosynovitis. Diffuse T2 hyperintensity and low level enhancement throughout the forefoot musculature consistent with diabetic myopathy. No focal intramuscular fluid collections. Soft tissues Focal skin ulceration plantar to the 1st metatarsophalangeal joint consistent with the patient's ulcer. There is an underlying heterogeneous fluid collection or phlegmon which abuts the sesamoids, measuring 2.8 x 2.6 x 1.6 cm. There is generalized poor enhancement of the subcutaneous fat in the ball of the foot. No other focal fluid collections are seen. No evidence of soft tissue emphysema or foreign body. There is a small pressure lesion plantar to the 5th MTP joint. IMPRESSION: 1. Focal skin ulceration plantar to the 1st metatarsophalangeal joint consistent with the patient's ulcer. There is an underlying small abscess or phlegmon which abuts the sesamoids, measuring 2.8 x 2.6 x 1.6 cm. 2. No evidence of osteomyelitis  or septic joint. 3. Diffuse poor enhancement of the subcutaneous fat in the ball of the foot consistent with poor perfusion and possible devitalized tissue. Electronically Signed   By: Richardean Sale M.D.   On: 08/27/2020 14:40    Positive ROS: All other systems have been reviewed and were otherwise negative with the exception of those mentioned in the HPI and as above.  12 point ROS was performed.  Physical Exam: General: Alert and oriented.  No apparent distress.  Vascular:  Left foot:Dorsalis Pedis:  present Posterior Tibial:  present  Right foot: Dorsalis Pedis:  present Posterior Tibial:  present  Neuro:absent protective sensation  Derm: Right foot without ulceration  Left foot with noted ulceration to the plantar aspect of the left first MTPJ.  There is surrounding erythema.  Scant purulent drainage.  This probes directly down to bone and capsule at this time.  Noted edema to the foot as well.  Ortho/MS: Edema to the left foot.  Assessment: Abscess plantar left foot Diabetic neuropathy  Plan: MRI is negative for osteomyelitis.  At this point  we will plan on incision and drainage with debridement of all infected soft tissue.  The abscessed area does abut the sesamoid region and we will evaluate the sesamoids.  If there is any obvious changes to the sesamoid will likely remove the problematic area at that time.  I did discuss this with the patient in detail.  He was relieved that there is no bone infection.   We will plan for debridement tomorrow.  A deep wound culture has been performed in the outpatient clinic.  Blood cultures have been performed now.  Orders placed for surgery tomorrow.  Plan for tomorrow afternoon.    Elesa Hacker, DPM Cell [phone]   08/27/2020 5:30 PM

## 2020-08-27 NOTE — ED Provider Notes (Signed)
[facility] Emergency Department Provider Note  ____________________________________________   First MD Initiated Contact with Patient 08/27/20 1151     (approximate)  I have reviewed the triage vital signs and the nursing notes.   HISTORY  Chief Complaint Wound Infection   HPI Chris Kline is a 54 y.o. male with a past medical history of type 2 diabetes currently insulin-dependent, HTN, nephrolithiasis, vitamin D deficiency who presents for assessment after being referred to the ED by his podiatrist for further evaluation of a wound to the bottom of his left foot. Patient states he has had a blister under his left foot proximal to the first toe the past 3 weeks but that this got a lot bigger and painful over the last day and it was drained in podiatry clinic. He denies any other acute symptoms including fevers, chills, cough, nausea, vomiting, diarrhea, dysuria, chest pain, Don[place] pain, back pain, rash, or pain in any other extremity or joint. No personal episodes. No clearly feeding aggravating factors. Denies EtOH or illicit drug use. Patient does not recall any recent traumatic injuries to his left foot.         Past Medical History:  Diagnosis Date  . Diabetes mellitus without complication ([place])    type  . History of kidney stones   . Hypertension   . Nephrolithiasis   . Vitamin D deficiency     Patient Active Problem List   Diagnosis Date Noted  . Diabetic foot ulcers ([place]) 08/27/2020  . Left foot infection 08/27/2020  . Sepsis ([place]) 08/27/2020  . HTN (hypertension) 08/27/2020  . AKI (acute kidney injury) ([place]) 08/27/2020  . Diabetes mellitus without complication ([place]) 79/39/0300    Past Surgical History:  Procedure Laterality Date  . COLONOSCOPY WITH PROPOFOL N/A 02/02/2018   Procedure: COLONOSCOPY WITH PROPOFOL;  Surgeon: Manya Silvas, MD;  Location: [facility] ENDOSCOPY;  Service: Endoscopy;  Laterality: N/A;  . NO PAST SURGERIES     . WISDOM TOOTH EXTRACTION      Prior to Admission medications   Medication Sig Start Date End Date Taking? Authorizing Provider  Cholecalciferol 2000 units TABS Take 1 tablet by mouth daily.   Yes [provider]  lisinopril-hydrochlorothiazide (ZESTORETIC) 20-25 MG tablet Take 1 tablet by mouth daily. 08/12/20  Yes [provider]  metFORMIN (GLUCOPHAGE) 500 MG tablet Take 500 mg by mouth 2 (two) times daily with a meal.   Yes [provider]    Allergies Erythromycin  No family history on file.  Social History Social History   Tobacco Use  . Smoking status: Never Smoker  . Smokeless tobacco: Never Used  Vaping Use  . Vaping Use: Never used  Substance Use Topics  . Alcohol use: No  . Drug use: No    Review of Systems  Review of Systems  Constitutional: Negative for chills and fever.  HENT: Negative for sore throat.   Eyes: Negative for pain.  Respiratory: Negative for cough and stridor.   Cardiovascular: Negative for chest pain.  Gastrointestinal: Negative for vomiting.  Genitourinary: Negative for dysuria.  Musculoskeletal: Positive for joint pain ( 1st left toe of left foot) and myalgias ( L foot).  Skin: Negative for rash.  Neurological: Negative for seizures, loss of consciousness and headaches.  Psychiatric/Behavioral: Negative for suicidal ideas.  All other systems reviewed and are negative.    There is a circular ulcer at the base of the left foot proximal to the first toe. There is some  surrounding erythema tenderness and edema that tracks up to the midfoot. 2+ DP pulse. Sensation intact throughout the left foot. No other obvious evidence of abnormalities over the skin or large joint effusion. Right foot is unremarkable. ____________________________________________   PHYSICAL EXAM:  VITAL SIGNS: ED Triage Vitals [08/27/20 1011]  Enc Vitals Group     BP      Pulse      Resp      Temp      Temp src      SpO2      Weight  270 lb (122.5 kg)     Height 6' (1.829 m)     Head Circumference      Peak Flow      Pain Score 2     Pain Loc      Pain Edu?      Excl. in [place]?    Vitals:   08/27/20 1222  BP: (!) 143/83  Pulse: 87  Resp: 19  Temp: 98.8 F (37.1 C)  SpO2: 100%   Physical Exam Vitals and nursing note reviewed.  Constitutional:      Appearance: He is well-developed.  HENT:     Head: Normocephalic and atraumatic.     Right Ear: External ear normal.     Left Ear: External ear normal.     Nose: Nose normal.     Mouth/Throat:     Mouth: Mucous membranes are moist.  Eyes:     Conjunctiva/sclera: Conjunctivae normal.  Cardiovascular:     Rate and Rhythm: Normal rate and regular rhythm.     Heart sounds: No murmur heard.   Pulmonary:     Effort: Pulmonary effort is normal. No respiratory distress.     Breath sounds: Normal breath sounds.  Abdominal:     Palpations: Abdomen is soft.     Tenderness: There is no abdominal tenderness.  Musculoskeletal:     Cervical back: Neck supple.  Skin:    General: Skin is warm and dry.  Neurological:     Mental Status: He is alert and oriented to person, place, and time.       ____________________________________________   LABS (all labs ordered are listed, but only abnormal results are displayed)  Labs Reviewed  LACTIC ACID, PLASMA - Abnormal; Notable for the following components:      Result Value   Lactic Acid, Venous 2.8 (*)    All other components within normal limits  COMPREHENSIVE METABOLIC PANEL - Abnormal; Notable for the following components:   Sodium 133 (*)    CO2 21 (*)    Glucose, Bld 232 (*)    BUN 23 (*)    Creatinine, Ser 1.48 (*)    Total Protein 8.5 (*)    GFR, Estimated 53 (*)    All other components within normal limits  CBC WITH DIFFERENTIAL/PLATELET - Abnormal; Notable for the following components:   WBC 13.2 (*)    Neutro Abs 10.7 (*)    All other components within normal limits  SEDIMENTATION RATE - Abnormal;  Notable for the following components:   Sed Rate 58 (*)    All other components within normal limits  GLUCOSE, CAPILLARY - Abnormal; Notable for the following components:   Glucose-Capillary 149 (*)    All other components within normal limits  CULTURE, BLOOD (ROUTINE X 2)  CULTURE, BLOOD (ROUTINE X 2)  RESPIRATORY PANEL BY RT PCR (FLU A&B, COVID)  URINALYSIS, COMPLETE (UACMP) WITH MICROSCOPIC  C-REACTIVE PROTEIN  HEMOGLOBIN A1C  PROTIME-INR  APTT  PROCALCITONIN  LACTIC ACID, PLASMA  LACTIC ACID, PLASMA   ____________________________________________  ____________________________________________   PROCEDURES  Procedure(s) performed (including Critical Care):  .1-3 Lead EKG Interpretation Performed by: Lucrezia Starch, MD Authorized by: Lucrezia Starch, MD     Interpretation: normal     ECG rate assessment: normal     Rhythm: sinus rhythm     Ectopy: none     Conduction: normal   .Critical Care Performed by: Lucrezia Starch, MD Authorized by: Lucrezia Starch, MD   Critical care provider statement:    Critical care time (minutes):  30   Critical care was necessary to treat or prevent imminent or life-threatening deterioration of the following conditions:  Sepsis   Critical care was time spent personally by me on the following activities:  Discussions with consultants, evaluation of patient's response to treatment, examination of patient, ordering and performing treatments and interventions, ordering and review of laboratory studies, ordering and review of radiographic studies, pulse oximetry, re-evaluation of patient's condition, obtaining history from patient or surrogate and review of old charts     ____________________________________________   INITIAL IMPRESSION / [place] / ED COURSE        Patient presents with [place] to history exam for further evaluation management of concern for left foot diabetic ulcer. Patient initially was seen in podiatry  clinic where he had an abscess or blister drained and a culture was sent. This was done by Dr. Caryl Comes who I was able to reach and speak with. Per Dr. Caryl Comes plain films were also done that did not show clearly osteomyelitis although he believes patient will require IV antibiotics and possible inpatient debridement. On presentation patient is afebrile with stable vital signs on room air.. Initial labs obtained in triage remarkable for CMP with evidence of hyperglycemia with a glucose of 232 without evidence of acidosis. Patient's creatinine is 1.48 which represents an AKI given last creatinine of 0.88 obtained 3 years ago. No significant electrolyte or metabolic derangements. Patient is noted have leukocytosis with a WBC count of 13.2 otherwise unremarkable. Initial lactic is 2.8.  Impression is sepsis secondary to left foot infection likely stemming from diabetic foot ulcer although osteomyelitis is also within differential. Given plain films obtained in clinic reportedly do not show this per Dr. Caryl Comes he does recommend obtaining an MRI which I did order.  Given unclear source of infection with elevated white blood cell count and elevated lactic acid, blood cultures were obtained and patient was given IV fluids and broad-spectrum antibiotics. Podiatry service was consulted. We will plan to admit to medicine service for further evaluation management.  I was able to reach Dr. Vickki Muff who is on-call for podiatry who stated he would come see the patient in emergency room.  He agreed with MRI that Dr. Caryl Comes recommended.  MRI ordered.  I will plan to admit to medicine service for further evaluation management.   ____________________________________________   FINAL CLINICAL IMPRESSION(S) / ED DIAGNOSES  Final diagnoses:  Diabetic ulcer of left foot associated with type 2 diabetes mellitus, unspecified part of foot, unspecified ulcer stage (HCC)  Sepsis, due to unspecified organism, unspecified whether acute organ  dysfunction present ([place])  Hyperglycemia    Medications  vancomycin (VANCOCIN) IVPB 1000 mg/200 mL premix (has no administration in time range)  cefTRIAXone (ROCEPHIN) 2 g in sodium chloride 0.9 % 100 mL IVPB (2 g Intravenous New Bag/Given 08/27/20 1239)  insulin aspart (novoLOG) injection 0-9 Units (  has no administration in time range)  insulin aspart (novoLOG) injection 0-5 Units (has no administration in time range)  vancomycin (VANCOREADY) IVPB 1500 mg/300 mL (has no administration in time range)  0.9 %  sodium chloride infusion (has no administration in time range)  ondansetron (ZOFRAN) injection 4 mg (has no administration in time range)  acetaminophen (TYLENOL) tablet 650 mg (has no administration in time range)  hydrALAZINE (APRESOLINE) injection 5 mg (has no administration in time range)  cefTRIAXone (ROCEPHIN) 1 g in sodium chloride 0.9 % 100 mL IVPB (has no administration in time range)  lactated ringers bolus 2,328 mL (2,328 mLs Intravenous New Bag/Given 08/27/20 1221)     ED Discharge Orders    None       Note:  This document was prepared using Dragon voice recognition software and may include unintentional dictation errors.   Lucrezia Starch, MD 08/27/20 1255

## 2020-08-27 NOTE — Consult Note (Signed)
PHARMACY -  BRIEF ANTIBIOTIC NOTE   Pharmacy has received consult(s) for vancomycin from an ED provider.  The patient's profile has been reviewed for ht/wt/allergies/indication/available labs.    One time order(s) placed for Vancomycin 1000mg  IV and ceftriaxone 2g IV  Further antibiotics/pharmacy consults should be ordered by admitting physician if indicated.                       Sherilyn Banker, PharmD Pharmacy Resident  08/27/2020 12:17 PM

## 2020-08-27 NOTE — Consult Note (Signed)
CODE SEPSIS - PHARMACY COMMUNICATION  **Broad Spectrum Antibiotics should be administered within 1 hour of Sepsis diagnosis**  Time Code Sepsis Called/Page Received: 1202  Antibiotics Ordered:  Vancomycin 1000mg  IV x1 Ceftriaxone 2g IV x1  Time of 1st antibiotic administration: 1239  Additional action taken by pharmacy: Ordered additional vancomycin 1500mg  for adequate loading dose (Pt weight 122.5kg)  If necessary, Name of Provider/Nurse Contacted: N/A   Sherilyn Banker, PharmD Pharmacy Resident  08/27/2020 12:21 PM

## 2020-08-27 NOTE — Progress Notes (Signed)
Pharmacy Antibiotic Note  Chris Kline is a 54 y.o. male admitted on 08/27/2020 with sepsis due to diabetic foot ulcer with infection. PMH of T2DM, HTN, and nephrolithiasis. Patient was referred to the ED by his podiatrist for further evaluation of a wound to the bottom of his L foot. Pt had a blister the past 3 weeks but has gotten bigger and painful over the last day and it was drained in podiatry clinic. Pharmacy has been consulted for Vancomycin dosing.  Lactic acid 2.8; WBC 13.2; Scr 1.48; afebrile  Plan: Patient was ordered vancomycin 1000mg  loading dose; ordered additional 1500mg  IV vancomycin for adequate loading dose (total 2500mg ) given patient weight >120kg  Will order maintenance dose of Vancomycin 1000mg  IV every 12 hours per dosing nomogram. --Monitor renal function and adjust dose as clinically indicated. --Obtain vancomycin level prior to 4th or 5th dose  Patient also ordered ceftriaxone 1g IV q24 hours - will adjust to 2g IV q24 hours given patient weight >120kg  Height: 6' (182.9 cm) Weight: 122.5 kg (270 lb) IBW/kg (Calculated) : 77.6  Temp (24hrs), Avg:98.8 F (37.1 C), Min:98.8 F (37.1 C), Max:98.8 F (37.1 C)  Recent Labs  Lab 08/27/20 1015  WBC 13.2*  CREATININE 1.48*  LATICACIDVEN 2.8*    Estimated Creatinine Clearance: 77.2 mL/min (A) (by C-G formula based on SCr of 1.48 mg/dL (H)).    Allergies  Allergen Reactions  . Erythromycin Other (See Comments)    Abdominal pain    Antimicrobials this admission: 10/12 Vancomcyin >>  10/12 Ceftriaxone >>   Microbiology results: 10/12 BCx: pending  Thank you for allowing pharmacy to be a part of this patient's care.  Sherilyn Banker, PharmD Pharmacy Resident  08/27/2020 1:03 PM

## 2020-08-27 NOTE — ED Triage Notes (Addendum)
Pt come via POV from home with c/o left toe infection. Pt states a month ago he had the spot and went to PCP about it. Pt states he did home care for it and it seemed to get better. Pt states this weekend is when he recently noticed the swelling and went back to PCP today with MD Jens Som who advised him to come here. PCP cut open the spot. Pt states it was all the way to bone.  Pt states he is diabetic. Pt states pain, swelling and some drainage.

## 2020-08-27 NOTE — H&P (Addendum)
History and Physical    Chris Kline WOE:[id number] DOB: 11-Jan-1966 DOA: 08/27/2020  Referring MD/NP/PA:   PCP: Sallee Lange, NP   Patient coming from:  The patient is coming from home.  At baseline, pt is independent for most of ADL.        Chief Complaint: left foot ulcer with infection  HPI: Chris Kline is a 54 y.o. male with medical history significant of HTN, DM, kidney stone, who presents with a left foot ulcer with infection.  Pt states that initially he noted small left foot plantar blister under 3 weeks ago. This ulcer has been progressively worsening and enlarging, started draining pus.  Right foot become swelling with erythema surrounding foot ulcer. He has mild foot pain.  Patient denies fever or chills.  No chest pain, shortness breath, cough.  Denies nausea vomiting, diarrhea, abdominal pain, symptoms of UTI or unilateral weakness. Pt was seen by Dr. Cleda Mccreedy of podiatry in office, and reportedly had x-ray done, which did not show clear evidence of osteomyelitis.   ED Course: pt was found to have WBC 13.2, lactic acid 2.8, pending COVID-19 PCR, AKI with creatinine 1.48, BUN 23, temperature 98.8, blood pressure 143/83, initially tachycardia with heart rate 91 which improved to 87 after giving IV fluid, RR 17, oxygen saturation 99% on room air.  Patient is admitted to [facility] bed as inpatient.  Podiatry, Dr. Vickki Muff is consulted.   Review of Systems:   General: no fevers, chills, no body weight gain, has fatigue HEENT: no blurry vision, hearing changes or sore throat Respiratory: no dyspnea, coughing, wheezing CV: no chest pain, no palpitations GI: no nausea, vomiting, abdominal pain, diarrhea, constipation GU: no dysuria, burning on urination, increased urinary frequency, hematuria  Ext: no leg edema Neuro: no unilateral weakness, numbness, or tingling, no vision change or hearing loss Skin: has ulcer in left foot plantar area with pus draining MSK: No  muscle spasm, no deformity, no limitation of range of movement in spin Heme: No easy bruising.  Travel history: No recent long distant travel.  Allergy:  Allergies  Allergen Reactions  . Erythromycin Other (See Comments)    Abdominal pain    Past Medical History:  Diagnosis Date  . Diabetes mellitus without complication ([place])    type  . History of kidney stones   . Hypertension   . Nephrolithiasis   . Vitamin D deficiency     Past Surgical History:  Procedure Laterality Date  . COLONOSCOPY WITH PROPOFOL N/A 02/02/2018   Procedure: COLONOSCOPY WITH PROPOFOL;  Surgeon: Manya Silvas, MD;  Location[facility] ENDOSCOPY;  Service: Endoscopy;  Laterality: N/A;  . NO PAST SURGERIES    . WISDOM TOOTH EXTRACTION      Social History:  reports that he has never smoked. He has never used smokeless tobacco. He reports that he does not drink alcohol and does not use drugs.  Family History:  Family History  Problem Relation Age of Onset  . Heart attack Mother   . Diabetes Mellitus II Father   . Stroke Father   . Heart attack Father   . Lung cancer Sister      Prior to Admission medications   Medication Sig Start Date End Date Taking? Authorizing Provider  acetaminophen (TYLENOL) 500 MG tablet Take 1,000 mg by mouth every 6 (six) hours as needed.    [provider]  aspirin 81 MG chewable tablet Chew by mouth daily.    [provider]  chlorpheniramine (  CHLOR-TRIMETON) 4 MG tablet Take 4 mg by mouth as needed for allergies.    [provider]  Cholecalciferol 2000 units TABS Take 1 tablet by mouth daily.    [provider]  metFORMIN (GLUCOPHAGE) 500 MG tablet Take 500 mg by mouth 2 (two) times daily with a meal.    [provider]  naproxen sodium (ALEVE) 220 MG tablet Take 440 mg by mouth 2 (two) times daily as needed.    [provider]    Physical Exam: Vitals:   08/27/20 1011 08/27/20 1222  BP:  (!) 143/83  Pulse:  87    Resp:  19  Temp:  98.8 F (37.1 C)  TempSrc:  Oral  SpO2:  100%  Weight: 122.5 kg 122.5 kg  Height: 6' (1.829 m) 6' (1.829 m)   General: Not in acute distress HEENT:       Eyes: PERRL, EOMI, no scleral icterus.       ENT: No discharge from the ears and nose, no pharynx injection, no tonsillar enlargement.        Neck: No JVD, no bruit, no mass felt. Heme: No neck lymph node enlargement. Cardiac: S1/S2, RRR, No murmurs, No gallops or rubs. Respiratory: No rales, wheezing, rhonchi or rubs. GI: Soft, nondistended, nontender, no rebound pain, no organomegaly, BS present. GU: No hematuria Ext: No pitting leg edema bilaterally. 1+DP/PT pulse bilaterally. Musculoskeletal: No joint deformities, No joint redness or warmth, no limitation of ROM in spin. Skin: has ulcer in left foot plantar area with little pus draining, with surrounding erythema, tenderness and swelling.     Neuro: Alert, oriented X3, cranial nerves II-XII grossly intact, moves all extremities normally.  Psych: Patient is not psychotic, no suicidal or hemocidal ideation.  Labs on Admission: I have personally reviewed following labs and imaging studies  CBC: Recent Labs  Lab 08/27/20 1015  WBC 13.2*  NEUTROABS 10.7*  HGB 13.5  HCT 40.0  MCV 86.4  PLT 536   Basic Metabolic Panel: Recent Labs  Lab 08/27/20 1015  NA 133*  K 4.5  CL 99  CO2 21*  GLUCOSE 232*  BUN 23*  CREATININE 1.48*  CALCIUM 9.6   GFR: Estimated Creatinine Clearance: 77.2 mL/min (A) (by C-G formula based on SCr of 1.48 mg/dL (H)). Liver Function Tests: Recent Labs  Lab 08/27/20 1015  AST 17  ALT 20  ALKPHOS 49  BILITOT 1.0  PROT 8.5*  ALBUMIN 4.3   No results for input(s): LIPASE, AMYLASE in the last 168 hours. No results for input(s): AMMONIA in the last 168 hours. Coagulation Profile: No results for input(s): INR, PROTIME in the last 168 hours. Cardiac Enzymes: No results for input(s): CKTOTAL, CKMB, CKMBINDEX, TROPONINI  in the last 168 hours. BNP (last 3 results) No results for input(s): PROBNP in the last 8760 hours. HbA1C: No results for input(s): HGBA1C in the last 72 hours. CBG: Recent Labs  Lab 08/27/20 1235  GLUCAP 149*   Lipid Profile: No results for input(s): CHOL, HDL, LDLCALC, TRIG, CHOLHDL, LDLDIRECT in the last 72 hours. Thyroid Function Tests: No results for input(s): TSH, T4TOTAL, FREET4, T3FREE, THYROIDAB in the last 72 hours. Anemia Panel: No results for input(s): VITAMINB12, FOLATE, FERRITIN, TIBC, IRON, RETICCTPCT in the last 72 hours. Urine analysis:    Component Value Date/Time   COLORURINE Yellow 06/08/2013 0618   APPEARANCEUR Clear 06/08/2013 0618   LABSPEC 1.017 06/08/2013 0618   PHURINE 5.0 06/08/2013 0618   GLUCOSEU Negative 06/08/2013 0618  HGBUR 3+ 06/08/2013 0618   BILIRUBINUR Negative 06/08/2013 0618   KETONESUR Negative 06/08/2013 0618   PROTEINUR 30 mg/dL 06/08/2013 0618   NITRITE Negative 06/08/2013 0618   LEUKOCYTESUR Trace 06/08/2013 0618   Sepsis Labs: '@LABRCNTIP' (procalcitonin:4,lacticidven:4) ) Recent Results (from the past 240 hour(s))  Aerobic Culture (superficial specimen)     Status: None (Preliminary result)   Collection Time: 08/27/20 10:00 AM   Specimen: Wound  Result Value Ref Range Status   Specimen Description   Final    WOUND Performed at [facility], [address], [place], [place] [address]    Special Requests   Final    LEFT FOOT Performed at [facility], [address] [place], [place] [address]    Gram Stain   Final    NO WBC SEEN RARE GRAM POSITIVE COCCI Performed at [facility], [place] [address], [place], [place] [address]    Culture PENDING  Incomplete   Report Status PENDING  Incomplete     Radiological Exams on Admission: DG Eye Foreign Body  Result Date: 08/27/2020 CLINICAL DATA:  Metal working/exposure; clearance prior to MRI EXAM: ORBITS FOR FOREIGN BODY - 2 VIEW COMPARISON:  None.  FINDINGS: There is no evidence of metallic foreign body within the orbits. No significant bone abnormality identified. IMPRESSION: No evidence of metallic foreign body within the orbits. Electronically Signed   By: Franchot Gallo M.D.   On: 08/27/2020 13:24     EKG: Not done in ED, will get one.   Assessment/Plan Principal Problem:   Left foot infection Active Problems:   Diabetic foot ulcers (HCC)   Sepsis (HCC)   HTN (hypertension)   AKI (acute kidney injury) ([place])   Diabetes mellitus without complication ([place])   Sepsis due to left foot diabetic foot ulcer with infection: Patient meets criteria for sepsis with leukocytosis with WBC 13.2, tachycardia with initial heart rate in 91.  Lactic acid of 2.8. Currently hemodynamically stable.   - Admitted to MedSurg bed as inpatient - Empiric antimicrobial treatment with vancomycin and Rocephin - PRN Zofran for nausea, tylenol - Blood cultures x 2  - ESR and CRP - wound care consult - MRI-left foot  - will get Procalcitonin and trend lactic acid levels per sepsis protocol. - IVF: 2.3 L of LR and 1L of NS, followed by 75 cc/h of NS  HTN (hypertension): Blood pressure 143/83.  Patient is not taking medications currently. -IV hydralazine as needed -Hold lisinopril-HCTZ due to AKI  AKI (acute kidney injury) ([place]): Likely due to dehydration and continuation of lisinopril-HCTZ -Lisinopril-HCTZ -IV fluid as above -Follow-up renal function with BMP  Diabetes mellitus without complication [facility]): Recent A1c 7.1, poorly controlled.  Patient is taking Metformin at home. -SSI    DVT ppx: SQ Heparin        Code Status: Full code Family Communication: not done, no family member is at bed side.   Disposition Plan:  Anticipate discharge back to previous environment Consults called:  Dr. Vickki Muff of podiatry Admission status: Med-surg bed as inpt    Status is: Inpatient  Remains inpatient appropriate because:Inpatient level of care  appropriate due to severity of illness.  Patient has multiple comorbidities, now presents with diabetic foot ulcer with infection.  Patient patient meets criteria for sepsis.  His presentation is highly complicated patient at high risk of deteriorating.  Patient will need to be treated in the hospital for at least 2 days.  Dispo: The patient is from: Home  Anticipated d/c is to: Home              Anticipated d/c date is: 2 days              Patient currently is not medically stable to d/c.          Date of Service 08/27/2020    Ivor Costa [facility]   If 7PM-7AM, please contact night-coverage [email or web address] 08/27/2020, 1:36 PM

## 2020-08-28 ENCOUNTER — Inpatient Hospital Stay: Payer: BC Managed Care – PPO | Admitting: Anesthesiology

## 2020-08-28 ENCOUNTER — Encounter
Admission: EM | Disposition: A | Discharge status: Home Health | Payer: Self-pay | Source: Home / Self Care | Attending: Internal Medicine

## 2020-08-28 ENCOUNTER — Encounter: Payer: Self-pay | Admitting: Internal Medicine

## 2020-08-28 DIAGNOSIS — L089 Local infection of the skin and subcutaneous tissue, unspecified: Secondary | ICD-10-CM

## 2020-08-28 HISTORY — PX: IRRIGATION AND DEBRIDEMENT FOOT: SHX6602

## 2020-08-28 LAB — CULTURE, BLOOD (ROUTINE X 2)

## 2020-08-28 LAB — BASIC METABOLIC PANEL
Anion gap: 9 (ref 5–15)
BUN: 21 mg/dL — ABNORMAL HIGH (ref 6–20)
CO2: 23 mmol/L (ref 22–32)
Calcium: 8.9 mg/dL (ref 8.9–10.3)
Chloride: 104 mmol/L (ref 98–111)
Creatinine, Ser: 1.09 mg/dL (ref 0.61–1.24)
GFR, Estimated: >60 mL/min (ref >60)
Glucose, Bld: 158 mg/dL — ABNORMAL HIGH (ref 70–99)
Potassium: 4.3 mmol/L (ref 3.5–5.1)
Sodium: 136 mmol/L (ref 135–145)

## 2020-08-28 LAB — CBC
HCT: 34.7 % — ABNORMAL LOW (ref 39.0–52.0)
Hemoglobin: 11.7 g/dL — ABNORMAL LOW (ref 13.0–17.0)
MCH: 29.1 pg (ref 26.0–34.0)
MCHC: 33.7 g/dL (ref 30.0–36.0)
MCV: 86.3 fL (ref 80.0–100.0)
Platelets: 233 10*3/uL (ref 150–400)
RBC: 4.02 MIL/uL — ABNORMAL LOW (ref 4.22–5.81)
RDW: 12.6 % (ref 11.5–15.5)
WBC: 10.1 10*3/uL (ref 4.0–10.5)
nRBC: 0 % (ref 0.0–0.2)

## 2020-08-28 LAB — SURGICAL PCR SCREEN
MRSA, PCR: NEGATIVE
Staphylococcus aureus: POSITIVE — AB

## 2020-08-28 LAB — GLUCOSE, CAPILLARY
Glucose-Capillary: 139 mg/dL — ABNORMAL HIGH (ref 70–99)
Glucose-Capillary: 136 mg/dL — ABNORMAL HIGH (ref 70–99)
Glucose-Capillary: 112 mg/dL — ABNORMAL HIGH (ref 70–99)
Glucose-Capillary: 123 mg/dL — ABNORMAL HIGH (ref 70–99)
Glucose-Capillary: 277 mg/dL — ABNORMAL HIGH (ref 70–99)

## 2020-08-28 SURGERY — IRRIGATION AND DEBRIDEMENT FOOT
Anesthesia: General | Site: Foot | Laterality: Left

## 2020-08-28 MED ORDER — PROPOFOL 10 MG/ML IV BOLUS
INTRAVENOUS | Status: DC | PRN
  Administered 2020-08-28: 250 mg via INTRAVENOUS

## 2020-08-28 MED ORDER — OXYCODONE HCL 5 MG/5ML PO SOLN: 5.0000 mg | Freq: Once | ORAL | Status: DC | PRN

## 2020-08-28 MED ORDER — OXYCODONE HCL 5 MG PO TABS: 5.0000 mg | ORAL_TABLET | Freq: Once | ORAL | Status: DC | PRN

## 2020-08-28 MED ORDER — CHLORHEXIDINE GLUCONATE 4 % EX LIQD
60.0000 mL | Freq: Once | CUTANEOUS | Status: DC
  Administered 2020-08-28: 4 via TOPICAL

## 2020-08-28 MED ORDER — LINEZOLID 600 MG PO TABS
600.0000 mg | ORAL_TABLET | Freq: Two times a day (BID) | ORAL | Status: DC
  Administered 2020-08-28 – 2020-08-29 (×3): 600 mg via ORAL
  Filled 2020-08-28 (×4): qty 1

## 2020-08-28 MED ORDER — ONDANSETRON HCL 4 MG/2ML IJ SOLN
INTRAMUSCULAR | Status: AC
  Filled 2020-08-28: qty 2

## 2020-08-28 MED ORDER — ACETAMINOPHEN 10 MG/ML IV SOLN
INTRAVENOUS | Status: DC | PRN
  Administered 2020-08-28: 1000 mg via INTRAVENOUS

## 2020-08-28 MED ORDER — CHLORHEXIDINE GLUCONATE CLOTH 2 % EX PADS
6.0000 | MEDICATED_PAD | Freq: Every day | CUTANEOUS | Status: DC
  Administered 2020-08-29 – 2020-08-30 (×2): 6 via TOPICAL

## 2020-08-28 MED ORDER — FENTANYL CITRATE (PF) 100 MCG/2ML IJ SOLN
INTRAMUSCULAR | Status: DC | PRN
  Administered 2020-08-28: 50 ug via INTRAVENOUS
  Administered 2020-08-28 (×2): 25 ug via INTRAVENOUS

## 2020-08-28 MED ORDER — LIDOCAINE HCL (PF) 2 % IJ SOLN
INTRAMUSCULAR | Status: AC
  Filled 2020-08-28: qty 5

## 2020-08-28 MED ORDER — LIDOCAINE HCL (CARDIAC) PF 100 MG/5ML IV SOSY
PREFILLED_SYRINGE | INTRAVENOUS | Status: DC | PRN
  Administered 2020-08-28: 100 mg via INTRAVENOUS

## 2020-08-28 MED ORDER — MIDAZOLAM HCL 2 MG/2ML IJ SOLN
INTRAMUSCULAR | Status: DC | PRN
  Administered 2020-08-28: 2 mg via INTRAVENOUS

## 2020-08-28 MED ORDER — PROPOFOL 500 MG/50ML IV EMUL
INTRAVENOUS | Status: AC
  Filled 2020-08-28: qty 50

## 2020-08-28 MED ORDER — DEXMEDETOMIDINE (PRECEDEX) IN NS 20 MCG/5ML (4 MCG/ML) IV SYRINGE
PREFILLED_SYRINGE | INTRAVENOUS | Status: AC
  Filled 2020-08-28: qty 5

## 2020-08-28 MED ORDER — MIDAZOLAM HCL 2 MG/2ML IJ SOLN
INTRAMUSCULAR | Status: AC
  Filled 2020-08-28: qty 2

## 2020-08-28 MED ORDER — DEXMEDETOMIDINE HCL IN NACL 200 MCG/50ML IV SOLN
INTRAVENOUS | Status: DC | PRN
  Administered 2020-08-28: 20 ug via INTRAVENOUS

## 2020-08-28 MED ORDER — FENTANYL CITRATE (PF) 100 MCG/2ML IJ SOLN
INTRAMUSCULAR | Status: AC
Start: 2020-08-28 — End: ?
  Filled 2020-08-28: qty 2

## 2020-08-28 MED ORDER — POVIDONE-IODINE 10 % EX SWAB: 2.0000 "application " | Freq: Once | CUTANEOUS | Status: DC

## 2020-08-28 MED ORDER — DEXAMETHASONE SODIUM PHOSPHATE 10 MG/ML IJ SOLN
INTRAMUSCULAR | Status: DC | PRN
  Administered 2020-08-28: 10 mg via INTRAVENOUS

## 2020-08-28 MED ORDER — LIDOCAINE-EPINEPHRINE 1 %-1:100000 IJ SOLN
INTRAMUSCULAR | Status: DC | PRN
  Administered 2020-08-28: 5 mL

## 2020-08-28 MED ORDER — FENTANYL CITRATE (PF) 100 MCG/2ML IJ SOLN: 25.0000 ug | INTRAMUSCULAR | Status: DC | PRN

## 2020-08-28 MED ORDER — LINEZOLID 600 MG/300ML IV SOLN
600.0000 mg | Freq: Once | INTRAVENOUS | Status: AC
  Administered 2020-08-28: 600 mg via INTRAVENOUS
  Filled 2020-08-28: qty 300

## 2020-08-28 MED ORDER — BUPIVACAINE HCL 0.5 % IJ SOLN
INTRAMUSCULAR | Status: DC | PRN
  Administered 2020-08-28: 5 mL

## 2020-08-28 MED ORDER — ONDANSETRON HCL 4 MG/2ML IJ SOLN
INTRAMUSCULAR | Status: DC | PRN
  Administered 2020-08-28: 4 mg via INTRAVENOUS

## 2020-08-28 MED ORDER — DEXAMETHASONE SODIUM PHOSPHATE 10 MG/ML IJ SOLN
INTRAMUSCULAR | Status: AC
  Filled 2020-08-28: qty 1

## 2020-08-28 MED ORDER — MUPIROCIN 2 % EX OINT
1.0000 "application " | TOPICAL_OINTMENT | Freq: Two times a day (BID) | CUTANEOUS | Status: DC
  Administered 2020-08-29 – 2020-08-30 (×3): 1 via NASAL
  Filled 2020-08-28: qty 22

## 2020-08-28 MED ORDER — ACETAMINOPHEN 10 MG/ML IV SOLN
INTRAVENOUS | Status: AC
  Filled 2020-08-28: qty 100

## 2020-08-28 SURGICAL SUPPLY — 59 items
BLADE OSC/SAGITTAL MD 5.5X18 (BLADE) IMPLANT
BLADE OSCILLATING/SAGITTAL (BLADE)
BLADE SW THK.38XMED LNG THN (BLADE) IMPLANT
BNDG COHESIVE 4X5 TAN STRL (GAUZE/BANDAGES/DRESSINGS) ×2 IMPLANT
BNDG COHESIVE 6X5 TAN STRL LF (GAUZE/BANDAGES/DRESSINGS) ×2 IMPLANT
BNDG CONFORM 2 STRL LF (GAUZE/BANDAGES/DRESSINGS) ×2 IMPLANT
BNDG CONFORM 3 STRL LF (GAUZE/BANDAGES/DRESSINGS) ×2 IMPLANT
BNDG ELASTIC 4X5.8 VLCR STR LF (GAUZE/BANDAGES/DRESSINGS) ×2 IMPLANT
BNDG ESMARK 4X12 TAN STRL LF (GAUZE/BANDAGES/DRESSINGS) ×2 IMPLANT
BNDG GAUZE 4.5X4.1 6PLY STRL (MISCELLANEOUS) ×2 IMPLANT
CANISTER SUCT 1200ML W/VALVE (MISCELLANEOUS) ×2 IMPLANT
CANISTER SUCT 3000ML PPV (MISCELLANEOUS) ×2 IMPLANT
COVER WAND RF STERILE (DRAPES) ×2 IMPLANT
CUFF TOURN SGL QUICK 12 (TOURNIQUET CUFF) IMPLANT
CUFF TOURN SGL QUICK 18X4 (TOURNIQUET CUFF) IMPLANT
DRAPE FLUOR MINI C-ARM 54X84 (DRAPES) IMPLANT
DRAPE XRAY CASSETTE 23X24 (DRAPES) IMPLANT
DRSG MEPILEX FLEX 3X3 (GAUZE/BANDAGES/DRESSINGS) IMPLANT
DURAPREP 26ML APPLICATOR (WOUND CARE) ×2 IMPLANT
ELECT REM PT RETURN 9FT ADLT (ELECTROSURGICAL) ×2
ELECTRODE REM PT RTRN 9FT ADLT (ELECTROSURGICAL) ×1 IMPLANT
GAUZE PACKING 1/4 X5 YD (GAUZE/BANDAGES/DRESSINGS) ×2 IMPLANT
GAUZE PACKING IODOFORM 1X5 (PACKING) ×2 IMPLANT
GAUZE SPONGE 4X4 12PLY STRL (GAUZE/BANDAGES/DRESSINGS) ×2 IMPLANT
GAUZE XEROFORM 1X8 LF (GAUZE/BANDAGES/DRESSINGS) ×2 IMPLANT
GLOVE BIO SURGEON STRL SZ7.5 (GLOVE) ×2 IMPLANT
GLOVE INDICATOR 8.0 STRL GRN (GLOVE) ×2 IMPLANT
GOWN STRL REUS W/ TWL XL LVL3 (GOWN DISPOSABLE) ×2 IMPLANT
GOWN STRL REUS W/TWL MED LVL3 (GOWN DISPOSABLE) ×4 IMPLANT
GOWN STRL REUS W/TWL XL LVL3 (GOWN DISPOSABLE) ×2
HANDPIECE VERSAJET DEBRIDEMENT (MISCELLANEOUS) ×2 IMPLANT
IV NS 1000ML (IV SOLUTION) ×1
IV NS 1000ML BAXH (IV SOLUTION) ×1 IMPLANT
KIT TURNOVER KIT A (KITS) ×2 IMPLANT
LABEL OR SOLS (LABEL) ×2 IMPLANT
NEEDLE FILTER BLUNT 18X 1/2SAF (NEEDLE) ×1
NEEDLE FILTER BLUNT 18X1 1/2 (NEEDLE) ×1 IMPLANT
NEEDLE HYPO 25X1 1.5 SAFETY (NEEDLE) ×2 IMPLANT
NS IRRIG 500ML POUR BTL (IV SOLUTION) ×2 IMPLANT
PACK EXTREMITY (MISCELLANEOUS) ×2 IMPLANT
PAD ABD DERMACEA PRESS 5X9 (GAUZE/BANDAGES/DRESSINGS) ×2 IMPLANT
PULSAVAC PLUS IRRIG FAN TIP (DISPOSABLE)
RASP SM TEAR CROSS CUT (RASP) IMPLANT
SHIELD FULL FACE ANTIFOG 7M (MISCELLANEOUS) ×2 IMPLANT
SOL .9 NS 3000ML IRR  AL (IV SOLUTION) ×1
SOL .9 NS 3000ML IRR UROMATIC (IV SOLUTION) ×1 IMPLANT
SOL PREP PVP 2OZ (MISCELLANEOUS) ×2
SOLUTION PREP PVP 2OZ (MISCELLANEOUS) ×1 IMPLANT
STOCKINETTE IMPERVIOUS 9X36 MD (GAUZE/BANDAGES/DRESSINGS) ×2 IMPLANT
SUT ETHILON 2 0 FS 18 (SUTURE) IMPLANT
SUT ETHILON 4-0 (SUTURE)
SUT ETHILON 4-0 FS2 18XMFL BLK (SUTURE)
SUT VIC AB 3-0 SH 27 (SUTURE)
SUT VIC AB 3-0 SH 27X BRD (SUTURE) IMPLANT
SUT VIC AB 4-0 FS2 27 (SUTURE) IMPLANT
SUTURE ETHLN 4-0 FS2 18XMF BLK (SUTURE) IMPLANT
SWAB CULTURE AMIES ANAERIB BLU (MISCELLANEOUS) IMPLANT
SYR 10ML LL (SYRINGE) ×4 IMPLANT
TIP FAN IRRIG PULSAVAC PLUS (DISPOSABLE) IMPLANT

## 2020-08-28 NOTE — Progress Notes (Signed)
PROGRESS NOTE  Chris Kline ZJI:[id number] DOB: 08/07/1966 DOA: 08/27/2020 PCP: Sallee Lange, NP  HPI/Recap of past 24 hours:  Chris Kline is a 54 y.o. male with medical history significant of HTN, DM, kidney stone, who presents with a left foot ulcer with infection.  Pt states that initially he noted small left foot plantar blister under 3 weeks ago. This ulcer has been progressively worsening and enlarging, started draining pus.  Right foot become swelling with erythema surrounding foot ulcer. He has mild foot pain.  Patient denies fever or chills.  No chest pain, shortness breath, cough.  Denies nausea vomiting, diarrhea, abdominal pain, symptoms of UTI or unilateral weakness. Pt was seen by Dr. Cleda Mccreedy of podiatry in office, and reportedly had x-ray done, which did not show clear evidence of osteomyelitis.  ED Course: pt was found to have WBC 13.2, lactic acid 2.8, pending COVID-19 PCR, AKI with creatinine 1.48, BUN 23, temperature 98.8, blood pressure 143/83, initially tachycardia with heart rate 91 which improved to 87 after giving IV fluid, RR 17, oxygen saturation 99% on room air.  Patient is admitted to [facility] bed as inpatient.  Podiatry, Dr. Vickki Muff is consulted.  08/28/20:  No pain, planned for I&D this afternoon by podiatry.   Assessment/Plan: Principal Problem:   Left foot infection Active Problems:   Diabetic foot ulcers (HCC)   Sepsis (HCC)   HTN (hypertension)   AKI (acute kidney injury) ([place])   Diabetes mellitus without complication ([place])   Left foot diabetic foot ulcer with infection and abscess Presented with WBC 13 point 2K Started on IV antibiotics empirically IV vancomycin and Rocephin. Switched to linezolid on 08/28/2020 Leukocytosis has resolved Monitor fever curve and WBC  Type 2 diabetes with hyperglycemia Obtain hemoglobin A1c Hold off oral hypoglycemics Continue insulin sliding scale  Polyneuropathy Resume home  regimen  Essential hypertension BP stable Resume home antihypertensives     Code Status: Full Family Communication: None at bedside  Disposition Plan: Home, when podiatry signs off   Consultants:  Podiatry  Procedures:  Planned I&D this afternoon.  Antimicrobials:  Linezolid with  Rocephin  DVT prophylaxis: SCDs  Status is: Inpatient    Dispo: The patient is from: Home.              Anticipated d/c is to: Home.              Anticipated d/c date is: 08/29/2020              Patient currently ongoing management of left foot ulcer.       Objective: Vitals:   08/27/20 1838 08/27/20 2047 08/28/20 0043 08/28/20 0419  BP: (!) 152/78 128/73 112/70 121/76  Pulse: 80 67 70 60  Resp: 16 19 18 19   Temp: 98.1 F (36.7 C) 98.5 F (36.9 C) 98.3 F (36.8 C) 97.8 F (36.6 C)  TempSrc: Oral Oral Oral Oral  SpO2: 99% 99% 99% 98%  Weight:      Height:        Intake/Output Summary (Last 24 hours) at 08/28/2020 0735 Last data filed at 08/27/2020 1629 Gross per 24 hour  Intake 3484.67 ml  Output --  Net 3484.67 ml   Filed Weights   08/27/20 1011 08/27/20 1222  Weight: 122.5 kg 122.5 kg    Exam:  . General: 54 y.o. year-old male well developed well nourished in no acute distress.  Alert and oriented x3. . Cardiovascular: Regular rate and rhythm with no rubs  or gallops.  No thyromegaly or JVD noted.   Marland Kitchen Respiratory: Clear to auscultation with no wheezes or rales. Good inspiratory effort. . Abdomen: Soft nontender nondistended with normal bowel sounds x4 quadrants. . Musculoskeletal: No lower extremity edema. 2/4 pulses in all 4 extremities. . Skin: left foot ulcer at plantar region . Psychiatry: Mood is appropriate for condition and setting   Data Reviewed: CBC: Recent Labs  Lab 08/27/20 1015 08/28/20 0445  WBC 13.2* 10.1  NEUTROABS 10.7*  --   HGB 13.5 11.7*  HCT 40.0 34.7*  MCV 86.4 86.3  PLT 281 893   Basic Metabolic Panel: Recent Labs   Lab 08/27/20 1015 08/28/20 0445  NA 133* 136  K 4.5 4.3  CL 99 104  CO2 21* 23  GLUCOSE 232* 158*  BUN 23* 21*  CREATININE 1.48* 1.09  CALCIUM 9.6 8.9   GFR: Estimated Creatinine Clearance: 104.8 mL/min (by C-G formula based on SCr of 1.09 mg/dL). Liver Function Tests: Recent Labs  Lab 08/27/20 1015  AST 17  ALT 20  ALKPHOS 49  BILITOT 1.0  PROT 8.5*  ALBUMIN 4.3   No results for input(s): LIPASE, AMYLASE in the last 168 hours. No results for input(s): AMMONIA in the last 168 hours. Coagulation Profile: Recent Labs  Lab 08/27/20 1328  INR 1.1   Cardiac Enzymes: No results for input(s): CKTOTAL, CKMB, CKMBINDEX, TROPONINI in the last 168 hours. BNP (last 3 results) No results for input(s): PROBNP in the last 8760 hours. HbA1C: Recent Labs    08/27/20 1015  HGBA1C 6.7*   CBG: Recent Labs  Lab 08/27/20 1235 08/27/20 2131  GLUCAP 149* 160*   Lipid Profile: No results for input(s): CHOL, HDL, LDLCALC, TRIG, CHOLHDL, LDLDIRECT in the last 72 hours. Thyroid Function Tests: No results for input(s): TSH, T4TOTAL, FREET4, T3FREE, THYROIDAB in the last 72 hours. Anemia Panel: No results for input(s): VITAMINB12, FOLATE, FERRITIN, TIBC, IRON, RETICCTPCT in the last 72 hours. Urine analysis:    Component Value Date/Time   COLORURINE Yellow 06/08/2013 0618   APPEARANCEUR Clear 06/08/2013 0618   LABSPEC 1.017 06/08/2013 0618   PHURINE 5.0 06/08/2013 0618   GLUCOSEU Negative 06/08/2013 0618   HGBUR 3+ 06/08/2013 0618   BILIRUBINUR Negative 06/08/2013 0618   KETONESUR Negative 06/08/2013 0618   PROTEINUR 30 mg/dL 06/08/2013 0618   NITRITE Negative 06/08/2013 0618   LEUKOCYTESUR Trace 06/08/2013 0618   Sepsis Labs: @LABRCNTIP (procalcitonin:4,lacticidven:4)  ) Recent Results (from the past 240 hour(s))  Aerobic Culture (superficial specimen)     Status: None (Preliminary result)   Collection Time: 08/27/20 10:00 AM   Specimen: Wound  Result Value Ref  Range Status   Specimen Description   Final    WOUND Performed at [facility], [address], [place], [place] [address]    Special Requests   Final    LEFT FOOT Performed at [facility], [address] [place], [place] [address]    Gram Stain   Final    NO WBC SEEN RARE GRAM POSITIVE COCCI Performed at [facility], [place] [address], [place], [place] [address]    Culture PENDING  Incomplete   Report Status PENDING  Incomplete  Respiratory Panel by RT PCR (Flu A&B, Covid) - Nasopharyngeal Swab     Status: None   Collection Time: 08/27/20 12:25 PM   Specimen: Nasopharyngeal Swab  Result Value Ref Range Status   SARS Coronavirus 2 by RT PCR NEGATIVE NEGATIVE Final    Comment: (  NOTE) SARS-CoV-2 target nucleic acids are NOT DETECTED.  The SARS-CoV-2 RNA is generally detectable in upper respiratoy specimens during the acute phase of infection. The lowest concentration of SARS-CoV-2 viral copies this assay can detect is 131 copies/mL. A negative result does not preclude SARS-Cov-2 infection and should not be used as the sole basis for treatment or other patient management decisions. A negative result may occur with  improper specimen collection/handling, submission of specimen other than nasopharyngeal swab, presence of viral mutation(s) within the areas targeted by this assay, and inadequate number of viral copies (<131 copies/mL). A negative result must be combined with clinical observations, patient history, and epidemiological information. The expected result is Negative.  Fact Sheet for Patients:  PinkCheek.be  Fact Sheet for Healthcare Providers:  GravelBags.it  This test is no t yet approved or cleared by the [place] FDA and  has been authorized for detection and/or diagnosis of SARS-CoV-2 by FDA under an Emergency Use Authorization (EUA). This EUA will remain  in effect (meaning  this test can be used) for the duration of the COVID-19 declaration under Section 564(b)(1) of the Act, 21 U.S.C. section 360bbb-3(b)(1), unless the authorization is terminated or revoked sooner.     Influenza A by PCR NEGATIVE NEGATIVE Final   Influenza B by PCR NEGATIVE NEGATIVE Final    Comment: (NOTE) The Xpert Xpress SARS-CoV-2/FLU/RSV assay is intended as an aid in  the diagnosis of influenza from Nasopharyngeal swab specimens and  should not be used as a sole basis for treatment. Nasal washings and  aspirates are unacceptable for Xpert Xpress SARS-CoV-2/FLU/RSV  testing.  Fact Sheet for Patients: PinkCheek.be  Fact Sheet for Healthcare Providers: GravelBags.it  This test is not yet approved or cleared by the [place] FDA and  has been authorized for detection and/or diagnosis of SARS-CoV-2 by  FDA under an Emergency Use Authorization (EUA). This EUA will remain  in effect (meaning this test can be used) for the duration of the  Covid-19 declaration under Section 564(b)(1) of the Act, 21  U.S.C. section 360bbb-3(b)(1), unless the authorization is  terminated or revoked. Performed at [facility], [address] [place], [place] [address]       Studies: DG Eye Foreign Body  Result Date: 08/27/2020 CLINICAL DATA:  Metal working/exposure; clearance prior to MRI EXAM: ORBITS FOR FOREIGN BODY - 2 VIEW COMPARISON:  None. FINDINGS: There is no evidence of metallic foreign body within the orbits. No significant bone abnormality identified. IMPRESSION: No evidence of metallic foreign body within the orbits. Electronically Signed   By: Franchot Gallo M.D.   On: 08/27/2020 13:24   MR FOOT LEFT W WO CONTRAST  Result Date: 08/27/2020 CLINICAL DATA:  Diabetic with foot swelling and enlarging plantar foot wound for 3 weeks. The wound was drained in the clinic. Clinical concern for osteomyelitis. EXAM: MRI OF  THE LEFT FOREFOOT WITHOUT AND WITH CONTRAST TECHNIQUE: Multiplanar, multisequence MR imaging of the left forefoot was performed both before and after administration of intravenous contrast. CONTRAST:  44mL GADAVIST GADOBUTROL 1 MMOL/ML IV SOLN COMPARISON:  None. FINDINGS: Despite efforts by the technologist and patient, mild motion artifact is present on today's exam and could not be eliminated. This reduces exam sensitivity and specificity. Bones/Joint/Cartilage No evidence of acute fracture, dislocation or osteomyelitis. Specifically, no suspicious marrow signal, abnormal enhancement or cortical destruction. Mild degenerative changes within the sesamoids of the 1st metatarsal. No significant joint effusions or suspicious synovial enhancement. Ligaments The Lisfranc ligament is  intact. The collateral ligaments of the metatarsophalangeal joints are intact. Muscles and Tendons Intact forefoot tendons without significant tenosynovitis. Diffuse T2 hyperintensity and low level enhancement throughout the forefoot musculature consistent with diabetic myopathy. No focal intramuscular fluid collections. Soft tissues Focal skin ulceration plantar to the 1st metatarsophalangeal joint consistent with the patient's ulcer. There is an underlying heterogeneous fluid collection or phlegmon which abuts the sesamoids, measuring 2.8 x 2.6 x 1.6 cm. There is generalized poor enhancement of the subcutaneous fat in the ball of the foot. No other focal fluid collections are seen. No evidence of soft tissue emphysema or foreign body. There is a small pressure lesion plantar to the 5th MTP joint. IMPRESSION: 1. Focal skin ulceration plantar to the 1st metatarsophalangeal joint consistent with the patient's ulcer. There is an underlying small abscess or phlegmon which abuts the sesamoids, measuring 2.8 x 2.6 x 1.6 cm. 2. No evidence of osteomyelitis or septic joint. 3. Diffuse poor enhancement of the subcutaneous fat in the ball of the foot  consistent with poor perfusion and possible devitalized tissue. Electronically Signed   By: Richardean Sale M.D.   On: 08/27/2020 14:40    Scheduled Meds: . cholecalciferol  2,000 Units Oral Daily  . heparin  5,000 Units Subcutaneous Q8H  . insulin aspart  0-5 Units Subcutaneous QHS  . insulin aspart  0-9 Units Subcutaneous TID WC    Continuous Infusions: . sodium chloride 75 mL/hr at 08/27/20 2024  . cefTRIAXone (ROCEPHIN)  IV    . vancomycin 1,000 mg (08/28/20 0241)     LOS: 1 day     Kayleen Memos, MD [facility] Pager [phone]  If 7PM-7AM, please contact night-coverage [email or web address] Password [facility] 08/28/2020, 7:35 AM

## 2020-08-28 NOTE — Anesthesia Preprocedure Evaluation (Signed)
Anesthesia Evaluation  Patient identified by MRN, date of birth, ID band Patient awake    Reviewed: Allergy & Precautions, H&P , NPO status , Patient's Chart, lab work & pertinent test results  History of Anesthesia Complications Negative for: history of anesthetic complications  Airway Mallampati: III  TM Distance: <3 FB Neck ROM: limited    Dental  (+) Chipped   Pulmonary neg pulmonary ROS, neg shortness of breath,    Pulmonary exam normal        Cardiovascular Exercise Tolerance: Good hypertension, (-) angina(-) Past MI and (-) DOE Normal cardiovascular exam     Neuro/Psych negative neurological ROS  negative psych ROS   GI/Hepatic negative GI ROS, Neg liver ROS, neg GERD  ,  Endo/Other  diabetes, Type 2  Renal/GU Renal disease     Musculoskeletal   Abdominal   Peds  Hematology negative hematology ROS (+)   Anesthesia Other Findings Past Medical History: No date: Diabetes mellitus without complication (HCC)     Comment:  type No date: History of kidney stones No date: Hypertension No date: Nephrolithiasis No date: Vitamin D deficiency  Past Surgical History: 02/02/2018: COLONOSCOPY WITH PROPOFOL; N/A     Comment:  Procedure: COLONOSCOPY WITH PROPOFOL;  Surgeon: Manya Silvas, MD;  Location[facility] ENDOSCOPY;  Service:               Endoscopy;  Laterality: N/A; No date: NO PAST SURGERIES No date: WISDOM TOOTH EXTRACTION  BMI    Body Mass Index: 36.62 kg/m      Reproductive/Obstetrics negative OB ROS                             Anesthesia Physical Anesthesia Plan  ASA: III  Anesthesia Plan: General LMA   Post-op Pain Management:    Induction: Intravenous  PONV Risk Score and Plan: Dexamethasone, Ondansetron, Midazolam and Treatment may vary due to age or medical condition  Airway Management Planned: LMA  Additional Equipment:   Intra-op Plan:    Post-operative Plan: Extubation in OR  Informed Consent: I have reviewed the patients History and Physical, chart, labs and discussed the procedure including the risks, benefits and alternatives for the proposed anesthesia with the patient or authorized representative who has indicated his/her understanding and acceptance.     Dental Advisory Given  Plan Discussed with: Anesthesiologist, CRNA and Surgeon  Anesthesia Plan Comments: (Patient consented for risks of anesthesia including but not limited to:  - adverse reactions to medications - damage to eyes, teeth, lips or other oral mucosa - nerve damage due to positioning  - sore throat or hoarseness - Damage to heart, brain, nerves, lungs, other parts of body or loss of life  Patient voiced understanding.)        Anesthesia Quick Evaluation

## 2020-08-28 NOTE — Anesthesia Procedure Notes (Signed)
Procedure Name: LMA Insertion Date/Time: 08/28/2020 4:43 PM Performed by: Johnna Acosta, CRNA Pre-anesthesia Checklist: Patient identified, Emergency Drugs available, Suction available, Patient being monitored and Timeout performed Patient Re-evaluated:Patient Re-evaluated prior to induction Oxygen Delivery Method: Circle system utilized Preoxygenation: Pre-oxygenation with 100% oxygen Induction Type: IV induction LMA: LMA inserted LMA Size: 5.0 Tube type: Oral Number of attempts: 1 Airway Equipment and Method: Oral airway Placement Confirmation: positive ETCO2 and breath sounds checked- equal and bilateral Tube secured with: Tape Dental Injury: Teeth and Oropharynx as per pre-operative assessment

## 2020-08-28 NOTE — Plan of Care (Signed)

## 2020-08-28 NOTE — Transfer of Care (Signed)
Immediate Anesthesia Transfer of Care Note  Patient: Chris Kline  Procedure(s) Performed: IRRIGATION AND DEBRIDEMENT FOOT (Left Foot)  Patient Location: PACU  Anesthesia Type:General  Level of Consciousness: sedated  Airway & Oxygen Therapy: Patient Spontanous Breathing and Patient connected to face mask oxygen  Post-op Assessment: Report given to RN and Post -op Vital signs reviewed and stable  Post vital signs: Reviewed and stable  Last Vitals:  Vitals Value Taken Time  BP 90/60 08/28/20 1735  Temp    Pulse 63 08/28/20 1735  Resp 16 08/28/20 1735  SpO2 98 % 08/28/20 1735  Vitals shown include unvalidated device data.  Last Pain:  Vitals:   08/28/20 1538  TempSrc: Oral  PainSc: 0-No pain         Complications: No complications documented.

## 2020-08-28 NOTE — Anesthesia Postprocedure Evaluation (Signed)
Anesthesia Post Note  Patient: Chris Kline  Procedure(s) Performed: IRRIGATION AND DEBRIDEMENT FOOT (Left Foot)  Patient location during evaluation: PACU Anesthesia Type: General Level of consciousness: awake and alert Pain management: pain level controlled Vital Signs Assessment: post-procedure vital signs reviewed and stable Respiratory status: spontaneous breathing, nonlabored ventilation, respiratory function stable and patient connected to nasal cannula oxygen Cardiovascular status: blood pressure returned to baseline and stable Postop Assessment: no apparent nausea or vomiting Anesthetic complications: no   No complications documented.   Last Vitals:  Vitals:   08/28/20 1747 08/28/20 1802  BP: 115/88 124/82  Pulse: 83 81  Resp: 19 19  Temp:  36.8 C  SpO2: 95% 96%    Last Pain:  Vitals:   08/28/20 1802  TempSrc:   PainSc: 0-No pain                 Precious Haws Haylin Camilli

## 2020-08-28 NOTE — Op Note (Signed)
Operative note   Surgeon:Almarosa Bohac Lawyer: None    Preop diagnosis: Abscess plantar left first MTPJ    Postop diagnosis: Same    Procedure: Incision and drainage abscess plantar left first MTPJ    EBL: Minimal    Anesthesia:local and general    Hemostasis: Ankle tourniquet inflated to 200 mmHg for approximately 20 minutes    Specimen: Deep wound culture    Complications: None    Operative indications:An E Lawhorn is an 54 y.o. that presents today for surgical intervention.  The risks/benefits/alternatives/complications have been discussed and consent has been given.    Procedure:  Patient was brought into the OR and placed on the operating table in thesupine position. After anesthesia was obtained theleft lower extremity was prepped and draped in usual sterile fashion.  Attention was directed to the plantar aspect of the left first MTPJ where a noted ulceration with abscess was found.  Incision was made proximal and distal to this.  Full-thickness dissection was carried down to the level of the capsule and longus flexor tendon.  A large amount of nonviable tissue was then excised sharply down to the level of the sesamoid bones.  Dissection was taken distal and proximal.  An area probe into the medial arch.  Scant purulent drainage was noted.  This was flushed with copious amounts of irrigation.  Distally in the webspace was explored without signs of abscess.  No further abscessed area.  The sesamoid apparatus was intact without erosive changes or break to the capsule.  The joint itself appeared to be intact as well.  At this time the proximal distal incisions were then closed with a nylon.  The central ulcerative site was packed open with iodoform gauze packing.  A bulky bandage was applied.  Patient will be readmitted to the floor for further evaluation.    Patient tolerated the procedure and anesthesia well.  Was transported from the OR to the PACU with all vital signs  stable and vascular status intact. To be discharged per routine protocol.

## 2020-08-29 ENCOUNTER — Encounter: Payer: Self-pay | Admitting: Podiatry

## 2020-08-29 LAB — BASIC METABOLIC PANEL
Anion gap: 10 (ref 5–15)
BUN: 23 mg/dL — ABNORMAL HIGH (ref 6–20)
CO2: 22 mmol/L (ref 22–32)
Calcium: 8.9 mg/dL (ref 8.9–10.3)
Chloride: 104 mmol/L (ref 98–111)
Creatinine, Ser: 1.18 mg/dL (ref 0.61–1.24)
GFR, Estimated: >60 mL/min (ref >60)
Glucose, Bld: 246 mg/dL — ABNORMAL HIGH (ref 70–99)
Potassium: 4.7 mmol/L (ref 3.5–5.1)
Sodium: 136 mmol/L (ref 135–145)

## 2020-08-29 LAB — CBC
HCT: 36.9 % — ABNORMAL LOW (ref 39.0–52.0)
Hemoglobin: 12.5 g/dL — ABNORMAL LOW (ref 13.0–17.0)
MCH: 29.3 pg (ref 26.0–34.0)
MCHC: 33.9 g/dL (ref 30.0–36.0)
MCV: 86.6 fL (ref 80.0–100.0)
Platelets: 279 10*3/uL (ref 150–400)
RBC: 4.26 MIL/uL (ref 4.22–5.81)
RDW: 12.3 % (ref 11.5–15.5)
WBC: 9.3 10*3/uL (ref 4.0–10.5)
nRBC: 0 % (ref 0.0–0.2)

## 2020-08-29 LAB — GLUCOSE, CAPILLARY
Glucose-Capillary: 172 mg/dL — ABNORMAL HIGH (ref 70–99)
Glucose-Capillary: 200 mg/dL — ABNORMAL HIGH (ref 70–99)
Glucose-Capillary: 235 mg/dL — ABNORMAL HIGH (ref 70–99)
Glucose-Capillary: 201 mg/dL — ABNORMAL HIGH (ref 70–99)

## 2020-08-29 LAB — AEROBIC CULTURE  (SUPERFICIAL SPECIMEN)

## 2020-08-29 MED ORDER — ENOXAPARIN SODIUM 80 MG/0.8ML ~~LOC~~ SOLN
0.5000 mg/kg | SUBCUTANEOUS | Status: DC
  Administered 2020-08-29: 62.5 mg via SUBCUTANEOUS
  Filled 2020-08-29 (×2): qty 0.8

## 2020-08-29 MED ORDER — SODIUM CHLORIDE 0.9 % IV SOLN
2.0000 g | INTRAVENOUS | Status: DC
  Filled 2020-08-29: qty 20

## 2020-08-29 MED ORDER — SODIUM CHLORIDE 0.9 % IV SOLN: INTRAVENOUS | Status: DC

## 2020-08-29 MED ORDER — ENOXAPARIN SODIUM 60 MG/0.6ML ~~LOC~~ SOLN
0.5000 mg/kg | SUBCUTANEOUS | Status: DC
  Filled 2020-08-29: qty 1.2

## 2020-08-29 NOTE — Progress Notes (Signed)
PROGRESS NOTE  Chris Kline DXI:[id number] DOB: 1966/10/17 DOA: 08/27/2020 PCP: Sallee Lange, NP  HPI/Recap of past 24 hours:  Chris Kline is a 54 y.o. male with medical history significant of HTN, DM, kidney stone, who presents with a left foot ulcer with infection.  Pt states that initially he noted small left foot plantar blister under 3 weeks ago. This ulcer has been progressively worsening and enlarging, started draining pus.  Right foot become swelling with erythema surrounding foot ulcer. He has mild foot pain.  Patient denies fever or chills.  No chest pain, shortness breath, cough.  Denies nausea vomiting, diarrhea, abdominal pain, symptoms of UTI or unilateral weakness. Pt was seen by Dr. Cleda Mccreedy of podiatry in office, and reportedly had x-ray done, which did not show clear evidence of osteomyelitis.  ED Course: pt was found to have WBC 13.2, lactic acid 2.8, pending COVID-19 PCR, AKI with creatinine 1.48, BUN 23, temperature 98.8, blood pressure 143/83, initially tachycardia with heart rate 91 which improved to 87 after giving IV fluid, RR 17, oxygen saturation 99% on room air.  Patient is admitted to [place] bed as inpatient.  Podiatry, Dr. Vickki Muff is consulted.  Post I&D on 08/28/20 by Dr. Vickki Muff.  08/29/20:  No pain, planned for PT evaluation with podiatry surgery guidance.   Assessment/Plan: Principal Problem:   Left foot infection Active Problems:   Diabetic foot ulcers (HCC)   Sepsis ([place])   HTN (hypertension)   AKI (acute kidney injury) ([place])   Diabetes mellitus without complication ([place])   Left foot diabetic foot ulcer with infection and abscess post I&D on 08/28/20 Presented with WBC 13.2K Started on IV antibiotics empirically IV vancomycin and Rocephin. Switched to linezolid on 08/28/2020 Leukocytosis has resolved.  Afebrile Deep wound gram stain, polymicrobial ID consulted for po abx recommendations  Type 2 diabetes with  hyperglycemia Hemoglobin A1c 6.7 on 08/27/20 Hold off oral hypoglycemics Continue insulin sliding scale  Polyneuropathy Continue home regimen  Essential hypertension BP stable Continue home antihypertensives     Code Status: Full Family Communication: None at bedside  Disposition Plan: Home, when podiatry signs off   Consultants:  Podiatry  Procedures:  Planned I&D this afternoon.  Antimicrobials:  Linezolid with  Rocephin  DVT prophylaxis: SCDs  Status is: Inpatient    Dispo: The patient is from: Home.              Anticipated d/c is to: Home.              Anticipated d/c date is: 08/30/2020              Patient currently ongoing management of left foot ulcer.       Objective: Vitals:   08/29/20 0359 08/29/20 0736 08/29/20 1125 08/29/20 1529  BP: (!) 161/76 (!) 146/80 121/81 134/66  Pulse: (!) 55 63 66 69  Resp: 18 16 16 16   Temp: 97.8 F (36.6 C) 97.9 F (36.6 C) 98.6 F (37 C) 98.5 F (36.9 C)  TempSrc: Oral Oral Oral Oral  SpO2: 99% 99% 97% 99%  Weight:      Height:        Intake/Output Summary (Last 24 hours) at 08/29/2020 1548 Last data filed at 08/29/2020 1350 Gross per 24 hour  Intake 1380.26 ml  Output 455 ml  Net 925.26 ml   Filed Weights   08/27/20 1011 08/27/20 1222  Weight: 122.5 kg 122.5 kg    Exam:  . General: 54 y.o.  year-old male well-developed well-nourished no acute distress.  Alert and oriented x3. .   Cardiovascular: Regular rate and rhythm no rubs or gallops.   Marland Kitchen Respiratory: Clear to auscultation no wheezes or rales.   . Abdomen: Soft nontender normal bowel sounds present.   . Musculoskeletal: Left foot in surgical dressing. Marland Kitchen Psychiatry: Mood is appropriate for condition and setting.   Data Reviewed: CBC: Recent Labs  Lab 08/27/20 1015 08/28/20 0445 08/29/20 0351  WBC 13.2* 10.1 9.3  NEUTROABS 10.7*  --   --   HGB 13.5 11.7* 12.5*  HCT 40.0 34.7* 36.9*  MCV 86.4 86.3 86.6  PLT 281 233 335    Basic Metabolic Panel: Recent Labs  Lab 08/27/20 1015 08/28/20 0445 08/29/20 0351  NA 133* 136 136  K 4.5 4.3 4.7  CL 99 104 104  CO2 21* 23 22  GLUCOSE 232* 158* 246*  BUN 23* 21* 23*  CREATININE 1.48* 1.09 1.18  CALCIUM 9.6 8.9 8.9   GFR: Estimated Creatinine Clearance: 96.8 mL/min (by C-G formula based on SCr of 1.18 mg/dL). Liver Function Tests: Recent Labs  Lab 08/27/20 1015  AST 17  ALT 20  ALKPHOS 49  BILITOT 1.0  PROT 8.5*  ALBUMIN 4.3   No results for input(s): LIPASE, AMYLASE in the last 168 hours. No results for input(s): AMMONIA in the last 168 hours. Coagulation Profile: Recent Labs  Lab 08/27/20 1328  INR 1.1   Cardiac Enzymes: No results for input(s): CKTOTAL, CKMB, CKMBINDEX, TROPONINI in the last 168 hours. BNP (last 3 results) No results for input(s): PROBNP in the last 8760 hours. HbA1C: Recent Labs    08/27/20 1015  HGBA1C 6.7*   CBG: Recent Labs  Lab 08/28/20 1547 08/28/20 1735 08/28/20 2117 08/29/20 0732 08/29/20 1138  GLUCAP 112* 123* 277* 172* 200*   Lipid Profile: No results for input(s): CHOL, HDL, LDLCALC, TRIG, CHOLHDL, LDLDIRECT in the last 72 hours. Thyroid Function Tests: No results for input(s): TSH, T4TOTAL, FREET4, T3FREE, THYROIDAB in the last 72 hours. Anemia Panel: No results for input(s): VITAMINB12, FOLATE, FERRITIN, TIBC, IRON, RETICCTPCT in the last 72 hours. Urine analysis:    Component Value Date/Time   COLORURINE Yellow 06/08/2013 0618   APPEARANCEUR Clear 06/08/2013 0618   LABSPEC 1.017 06/08/2013 0618   PHURINE 5.0 06/08/2013 0618   GLUCOSEU Negative 06/08/2013 0618   HGBUR 3+ 06/08/2013 0618   BILIRUBINUR Negative 06/08/2013 0618   KETONESUR Negative 06/08/2013 0618   PROTEINUR 30 mg/dL 06/08/2013 0618   NITRITE Negative 06/08/2013 0618   LEUKOCYTESUR Trace 06/08/2013 0618   Sepsis Labs: @LABRCNTIP (procalcitonin:4,lacticidven:4)  ) Recent Results (from the past 240 hour(s))  Aerobic  Culture (superficial specimen)     Status: None (Preliminary result)   Collection Time: 08/27/20 10:00 AM   Specimen: Wound  Result Value Ref Range Status   Specimen Description   Final    WOUND Performed at [facility], [address] [place], [place] [address]    Special Requests   Final    LEFT FOOT Performed at [facility], [address] [place], [place] [address]    Gram Stain   Final    NO WBC SEEN RARE GRAM POSITIVE COCCI Performed at [facility], [place] [address] [place], [place] [address]    Culture MODERATE STAPHYLOCOCCUS AUREUS  Final   Report Status PENDING  Incomplete   Organism ID, Bacteria STAPHYLOCOCCUS AUREUS  Final      Susceptibility   Staphylococcus aureus -  MIC*    CIPROFLOXACIN <=0.5 SENSITIVE Sensitive     ERYTHROMYCIN >=8 RESISTANT Resistant     GENTAMICIN <=0.5 SENSITIVE Sensitive     OXACILLIN <=0.25 SENSITIVE Sensitive     TETRACYCLINE <=1 SENSITIVE Sensitive     VANCOMYCIN 1 SENSITIVE Sensitive     TRIMETH/SULFA <=10 SENSITIVE Sensitive     CLINDAMYCIN <=0.25 SENSITIVE Sensitive     RIFAMPIN <=0.5 SENSITIVE Sensitive     Inducible Clindamycin NEGATIVE Sensitive     * MODERATE STAPHYLOCOCCUS AUREUS  Blood culture (routine x 2)     Status: None (Preliminary result)   Collection Time: 08/27/20 10:15 AM   Specimen: BLOOD RIGHT ARM  Result Value Ref Range Status   Specimen Description BLOOD RIGHT ARM  Final   Special Requests   Final    BOTTLES DRAWN AEROBIC AND ANAEROBIC Blood Culture adequate volume   Culture   Final    NO GROWTH 2 DAYS Performed at [facility], [address] [place], [place] [address]    Report Status PENDING  Incomplete  Blood culture (routine x 2)     Status: None (Preliminary result)   Collection Time: 08/27/20 12:25 PM   Specimen: BLOOD  Result Value Ref Range Status   Specimen Description BLOOD BLOOD LEFT HAND  Final   Special Requests   Final    BOTTLES DRAWN AEROBIC AND  ANAEROBIC Blood Culture results may not be optimal due to an excessive volume of blood received in culture bottles   Culture   Final    NO GROWTH 2 DAYS Performed at [facility], [address] [place], [place] [address]    Report Status PENDING  Incomplete  Respiratory Panel by RT PCR (Flu A&B, Covid) - Nasopharyngeal Swab     Status: None   Collection Time: 08/27/20 12:25 PM   Specimen: Nasopharyngeal Swab  Result Value Ref Range Status   SARS Coronavirus 2 by RT PCR NEGATIVE NEGATIVE Final    Comment: (NOTE) SARS-CoV-2 target nucleic acids are NOT DETECTED.  The SARS-CoV-2 RNA is generally detectable in upper respiratoy specimens during the acute phase of infection. The lowest concentration of SARS-CoV-2 viral copies this assay can detect is 131 copies/mL. A negative result does not preclude SARS-Cov-2 infection and should not be used as the sole basis for treatment or other patient management decisions. A negative result may occur with  improper specimen collection/handling, submission of specimen other than nasopharyngeal swab, presence of viral mutation(s) within the areas targeted by this assay, and inadequate number of viral copies (<131 copies/mL). A negative result must be combined with clinical observations, patient history, and epidemiological information. The expected result is Negative.  Fact Sheet for Patients:  PinkCheek.be  Fact Sheet for Healthcare Providers:  GravelBags.it  This test is no t yet approved or cleared by the [place] FDA and  has been authorized for detection and/or diagnosis of SARS-CoV-2 by FDA under an Emergency Use Authorization (EUA). This EUA will remain  in effect (meaning this test can be used) for the duration of the COVID-19 declaration under Section 564(b)(1) of the Act, 21 U.S.C. section 360bbb-3(b)(1), unless the authorization is terminated or revoked  sooner.     Influenza A by PCR NEGATIVE NEGATIVE Final   Influenza B by PCR NEGATIVE NEGATIVE Final    Comment: (NOTE) The Xpert Xpress SARS-CoV-2/FLU/RSV assay is intended as an aid in  the diagnosis of influenza from Nasopharyngeal swab specimens and  should not be used as a  sole basis for treatment. Nasal washings and  aspirates are unacceptable for Xpert Xpress SARS-CoV-2/FLU/RSV  testing.  Fact Sheet for Patients: PinkCheek.be  Fact Sheet for Healthcare Providers: GravelBags.it  This test is not yet approved or cleared by the [place] FDA and  has been authorized for detection and/or diagnosis of SARS-CoV-2 by  FDA under an Emergency Use Authorization (EUA). This EUA will remain  in effect (meaning this test can be used) for the duration of the  Covid-19 declaration under Section 564(b)(1) of the Act, 21  U.S.C. section 360bbb-3(b)(1), unless the authorization is  terminated or revoked. Performed at [facility], [address] [place], [place] [address]   Surgical pcr screen     Status: Abnormal   Collection Time: 08/28/20 11:24 AM   Specimen: Nasal Mucosa; Nasal Swab  Result Value Ref Range Status   MRSA, PCR NEGATIVE NEGATIVE Final   Staphylococcus aureus POSITIVE (A) NEGATIVE Final    Comment: (NOTE) The Xpert SA Assay (FDA approved for NASAL specimens in patients 27 years of age and older), is one component of a comprehensive surveillance program. It is not intended to diagnose infection nor to guide or monitor treatment. Performed at [facility], [address] [place], [place] [address]   Aerobic/Anaerobic Culture (surgical/deep wound)     Status: None (Preliminary result)   Collection Time: 08/28/20  4:27 PM   Specimen: PATH Amputaion Arm/Leg; Tissue  Result Value Ref Range Status   Specimen Description   Final    ABSCESS LEFT FOOT Performed at [facility], [address] [place], [place] [address]    Special Requests   Final    NONE Performed at [facility], [address] [place], [place] [address]    Gram Stain   Final    FEW WBC PRESENT,BOTH PMN AND MONONUCLEAR RARE GRAM NEGATIVE RODS RARE GRAM POSITIVE COCCI    Culture   Final    TOO YOUNG TO READ Performed at [facility], [place] [address], [place], [place] [address]    Report Status PENDING  Incomplete      Studies: No results found.  Scheduled Meds: . Chlorhexidine Gluconate Cloth  6 each Topical Q0600  . cholecalciferol  2,000 Units Oral Daily  . enoxaparin (LOVENOX) injection  0.5 mg/kg Subcutaneous Q24H  . insulin aspart  0-5 Units Subcutaneous QHS  . insulin aspart  0-9 Units Subcutaneous TID WC  . linezolid  600 mg Oral Q12H  . mupirocin ointment  1 application Nasal BID    Continuous Infusions: . sodium chloride 50 mL/hr at 08/29/20 0623  . cefTRIAXone (ROCEPHIN)  IV 2 g (08/29/20 0924)     LOS: 2 days     Kayleen Memos, MD [facility] Pager [phone]  If 7PM-7AM, please contact night-coverage [email or web address] Password [facility] 08/29/2020, 3:48 PM

## 2020-08-29 NOTE — Evaluation (Signed)
Physical Therapy Evaluation Patient Details Name: Chris Kline MRN: [id number] DOB: [birth year] Today's Date: 08/29/2020   History of Present Illness  Chris Kline is a 54 y.o. male with medical history significant of HTN, DM, polyneuropathy, kidney stone, who presents with a left foot ulcer with infection. MD assessment includes Abscess plantar left first MTPJ and pt is s/p Incision and drainage abscess plantar left first MTPJ.    Clinical Impression  Pt was pleasant and motivated to participate during the session. Pt was able to complete all bed mobility IND. Pt was educated on NWB precautions and sequencing with RW for transfers and ambulation. Pt was able to perform STS with SBA. Pt has some initial increased postural sway when coming up to fully standing but was able to easily maintain static stance. Pt was able to ambulate 3' with RW and Min guard A while maintaining precautions on L LE. Pt had some difficulty with stability but did not need PT support to correct. Pt displayed moderate symptoms of fatigue and needed a short break. Pt was able to ambulate an additional 3' and transfer to recliner, again with moderately increased fatigue. Pt will benefit from PT services in a SNF setting upon discharge to safely address deficits listed in patient problem list for decreased caregiver assistance and eventual return to PLOF.      Follow Up Recommendations SNF    Equipment Recommendations       Recommendations for Other Services       Precautions / Restrictions Restrictions Weight Bearing Restrictions: Yes LLE Weight Bearing: Non weight bearing Other Position/Activity Restrictions: NWB, can WB through heel for transfer if absolutely necessary      Mobility  Bed Mobility Overal bed mobility: Independent                Transfers Overall transfer level: Needs assistance   Transfers: Sit to/from Stand Sit to Stand: Supervision         General transfer comment:  pt able to easily come up to standing but has some shakiness upon coming up to fully standing  Ambulation/Gait Ambulation/Gait assistance: Min guard Gait Distance (Feet): 3 Feet x2 Assistive device: Rolling walker (2 wheeled)       General Gait Details: pt able to maintain NWB precautions during ambulation but displays increased fatigue afterwards  Stairs            Wheelchair Mobility    Modified Rankin (Stroke Patients Only)       Balance Overall balance assessment: Needs assistance   Sitting balance-Leahy Scale: Normal       Standing balance-Leahy Scale: Good Standing balance comment: pt displays some initial instability when coming up to standing but is able to maintain balance                             Pertinent Vitals/Pain Pain Assessment: No/denies pain    Home Living Family/patient expects to be discharged to:: Private residence Living Arrangements: Parent (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 Pt able to ambulate without limitations and drive. Pt reported that laundry was in detached basement. Pt needs to walk out back door 2 steps and then down and additional few steps to get to laundry machines.      Hand Dominance        Extremity/Trunk Assessment                Communication   Communication: No difficulties  Cognition Arousal/Alertness: Awake/alert Behavior During Therapy: WFL for tasks assessed/performed Overall Cognitive Status: Within Functional Limits for tasks assessed                                  General Comments: pt is pleasant is able to follow complex commands      General Comments      Exercises Other Exercises Other Exercises: pt educated on NWB precations Other Exercises: pt educated on sequencing with RW for STS and ambulation Other Exercise: Car sequencing education provided   Assessment/Plan    PT Assessment Patient needs continued PT services  PT Problem List Decreased strength;Cardiopulmonary status limiting activity;Decreased activity tolerance;Impaired sensation;Decreased coordination       PT Treatment Interventions DME instruction;Therapeutic exercise;Wheelchair mobility training;Gait training;Balance training;Stair training;Functional mobility training;Therapeutic activities;Patient/family education    PT Goals (Current goals can be found in the Care Plan section)  Acute Rehab PT Goals Patient Stated Goal: to get well enough to take care of mom PT Goal Formulation: With patient Time For Goal Achievement: 09/11/20 Potential to Achieve Goals: Good    Frequency Min 2X/week   Barriers to discharge        Co-evaluation               AM-PAC PT "6 Clicks" Mobility  Outcome Measure Help needed turning from your back to your side while in a flat bed without using bedrails?: None Help needed moving from lying on your back to sitting on the side of a flat bed without using bedrails?: None Help needed moving to and from a bed to a chair (including a wheelchair)?: A Little Help needed standing up from a chair using your arms (e.g., wheelchair or bedside chair)?: A Little Help needed to walk in hospital room?: A Little Help needed climbing 3-5 steps with a railing? : A Lot 6 Click Score: 19    End of Session Equipment Utilized During Treatment: Gait belt Activity Tolerance: Patient tolerated treatment well Patient left: in chair;with call bell/phone within reach Nurse Communication: Mobility status PT Visit Diagnosis:  Difficulty in walking, not elsewhere classified (R26.2);Unsteadiness on feet (R26.81)    Time: 5956-3875 PT Time Calculation (min) (ACUTE ONLY): 53 min   Charges:              Hervey Ard, SPT 08/29/20. 3:16 PM

## 2020-08-29 NOTE — Progress Notes (Signed)
Daily Progress Note   Subjective  - 1 Day Post-Op  Incision and drainage.  Doing well.  No pain.  Objective Vitals:   08/28/20 2320 08/29/20 0359 08/29/20 0736 08/29/20 1125  BP: 125/78 (!) 161/76 (!) 146/80 121/81  Pulse: 65 (!) 55 63 66  Resp: 16 18 16 16   Temp: 98.2 F (36.8 C) 97.8 F (36.6 C) 97.9 F (36.6 C) 98.6 F (37 C)  TempSrc: Oral Oral Oral Oral  SpO2: 98% 99% 99% 97%  Weight:      Height:        Physical Exam: Plantar wound is stable.  Just bloody drainage.  Packing removed.  Cellulitis into the arch is stable.     Laboratory CBC    Component Value Date/Time   WBC 9.3 08/29/2020 0351   HGB 12.5 (L) 08/29/2020 0351   HGB 13.8 06/08/2013 0618   HCT 36.9 (L) 08/29/2020 0351   HCT 42.6 06/08/2013 0618   PLT 279 08/29/2020 0351   PLT 261 06/08/2013 0618    BMET    Component Value Date/Time   NA 136 08/29/2020 0351   NA 136 06/08/2013 0618   K 4.7 08/29/2020 0351   K 4.5 06/08/2013 0618   CL 104 08/29/2020 0351   CL 103 06/08/2013 0618   CO2 22 08/29/2020 0351   CO2 29 06/08/2013 0618   GLUCOSE 246 (H) 08/29/2020 0351   GLUCOSE 189 (H) 06/08/2013 0618   BUN 23 (H) 08/29/2020 0351   BUN 12 06/08/2013 0618   CREATININE 1.18 08/29/2020 0351   CREATININE 1.31 (H) 06/08/2013 0618   CALCIUM 8.9 08/29/2020 0351   CALCIUM 9.4 06/08/2013 0618   GFRNONAA >60 08/29/2020 0351   GFRNONAA >60 06/08/2013 0618   GFRAA >60 02/28/2018 1404   GFRAA >60 06/08/2013 0618    Assessment/Planning: Status post I&D left foot for abscess   Culture in the office growing staph aureus.  Awaiting intraoperative cultures as well.  No bone infection at this point.  Can likely discharge on oral antibiotics.  Will reevaluate tomorrow.  Patient will likely need home health for dressing changes.  Will need the wound flushed and packed.  Discussed with patient need for nonweightbearing to the left foot.  If transferring should put weight on heel only but should once  again try to stay off left foot as much as possible.  Samara Deist A  08/29/2020, 1:02 PM

## 2020-08-29 NOTE — Consult Note (Addendum)
NAME: Chris Kline  DOB: [birth year]  MRN: [id number]  Date/Time: 08/29/2020 5:58 PM  REQUESTING PROVIDER: Dr.Hall Subjective:  REASON FOR CONSULT: left foot infection ? Chris Kline is a 54 y.o. male with a history of DM. HTN, renal stone presented to the ED with left foot infection. As per patient he noted gravel in his boot 3 weeks ago while working in the yard and then developed a blister. It got worse with redness and swelling and he went to see the podiatrist on 08/27/20 , a culture was taken and it grew MSSA. He was admitted to the hospital for Iv antibiotics and further debridement. MRI of the foot showed Focal skin ulceration plantar to the 1st metatarsophalangeal joint consistent with the patient's ulcer. There was an underlying small abscess or phlegmon which abuts the sesamoids, measuring 2.8 x2.6 x 1.6 cm. 2. No evidence of osteomyelitis or septic joint. He was initally started on vanco mycin and ceftriaxone and the latter changed to linezolid  He  underwent I/D on 10/13/21by Dr.Fowler. I am asked to see the patient for further management Past Medical History:  Diagnosis Date  . Diabetes mellitus without complication (Chris Kline)    type  . History of kidney stones   . Hypertension   . Nephrolithiasis   . Vitamin D deficiency     Past Surgical History:  Procedure Laterality Date  . COLONOSCOPY WITH PROPOFOL N/A 02/02/2018   Procedure: COLONOSCOPY WITH PROPOFOL;  Surgeon: Manya Silvas, MD;  Location: [facility] ENDOSCOPY;  Service: Endoscopy;  Laterality: N/A;  . IRRIGATION AND DEBRIDEMENT FOOT Left 08/28/2020   Procedure: IRRIGATION AND DEBRIDEMENT FOOT;  Surgeon: Samara Deist, DPM;  Location: [facility] ORS;  Service: Podiatry;  Laterality: Left;  . NO PAST SURGERIES    . WISDOM TOOTH EXTRACTION      Social History   Socioeconomic History  . Marital status: Single    Spouse name: Not on file  . Number of children: Not on file  . Years of education: Not on file  .  Highest education level: Not on file  Occupational History  . Not on file  Tobacco Use  . Smoking status: Never Smoker  . Smokeless tobacco: Never Used  Vaping Use  . Vaping Use: Never used  Substance and Sexual Activity  . Alcohol use: No  . Drug use: No  . Sexual activity: Not on file  Other Topics Concern  . Not on file  Social History Narrative  . Not on file   Social Determinants of Health   Financial Resource Strain:   . Difficulty of Paying Living Expenses: Not on file  Food Insecurity:   . Worried About Charity fundraiser in the Last Year: Not on file  . Ran Out of Food in the Last Year: Not on file  Transportation Needs:   . Lack of Transportation (Medical): Not on file  . Lack of Transportation (Non-Medical): Not on file  Physical Activity:   . Days of Exercise per Week: Not on file  . Minutes of Exercise per Session: Not on file  Stress:   . Feeling of Stress : Not on file  Social Connections:   . Frequency of Communication with Friends and Family: Not on file  . Frequency of Social Gatherings with Friends and Family: Not on file  . Attends Religious Services: Not on file  . Active Member of Clubs or Organizations: Not on file  . Attends Archivist Meetings: Not on file  .  Marital Status: Not on file  Intimate Partner Violence:   . Fear of Current or Ex-Partner: Not on file  . Emotionally Abused: Not on file  . Physically Abused: Not on file  . Sexually Abused: Not on file    Family History  Problem Relation Age of Onset  . Heart attack Mother   . Diabetes Mellitus II Father   . Stroke Father   . Heart attack Father   . Lung cancer Sister    Allergies  Allergen Reactions  . Erythromycin Other (See Comments)    Abdominal pain   ? Current Facility-Administered Medications  Medication Dose Route Frequency Provider Last Rate Last Admin  . 0.9 %  sodium chloride infusion   Intravenous Continuous Kayleen Memos, DO 50 mL/hr at 08/29/20 4098  Rate Verify at 08/29/20 0623  . acetaminophen (TYLENOL) tablet 650 mg  650 mg Oral Q6H PRN Samara Deist, DPM      . cefTRIAXone (ROCEPHIN) 2 g in sodium chloride 0.9 % 100 mL IVPB  2 g Intravenous Q24H Samara Deist, DPM 200 mL/hr at 08/29/20 0924 2 g at 08/29/20 0924  . Chlorhexidine Gluconate Cloth 2 % PADS 6 each  6 each Topical Q0600 Kayleen Memos, DO   6 each at 08/29/20 1191  . cholecalciferol (VITAMIN D3) tablet 2,000 Units  2,000 Units Oral Daily Samara Deist, DPM   2,000 Units at 08/29/20 4782  . enoxaparin (LOVENOX) injection 62.5 mg  0.5 mg/kg Subcutaneous Q24H Hallaji, Sheema M, RPH   62.5 mg at 08/29/20 1504  . hydrALAZINE (APRESOLINE) injection 5 mg  5 mg Intravenous Q2H PRN Samara Deist, DPM      . insulin aspart (novoLOG) injection 0-5 Units  0-5 Units Subcutaneous QHS Samara Deist, DPM   3 Units at 08/28/20 2235  . insulin aspart (novoLOG) injection 0-9 Units  0-9 Units Subcutaneous TID WC Samara Deist, DPM   2 Units at 08/29/20 1215  . linezolid (ZYVOX) tablet 600 mg  600 mg Oral Q12H Samara Deist, DPM   600 mg at 08/29/20 9562  . mupirocin ointment (BACTROBAN) 2 % 1 application  1 application Nasal BID Kayleen Memos, DO   1 application at 13/08/65 7846  . ondansetron (ZOFRAN) injection 4 mg  4 mg Intravenous Q8H PRN Samara Deist, DPM      . senna-docusate (Senokot-S) tablet 1 tablet  1 tablet Oral QHS PRN Samara Deist, DPM         Abtx:  Anti-infectives (From admission, onward)   Start     Dose/Rate Route Frequency Ordered Stop   08/28/20 2200  linezolid (ZYVOX) tablet 600 mg        600 mg Oral Every 12 hours 08/28/20 1222     08/28/20 1400  linezolid (ZYVOX) IVPB 600 mg        600 mg 300 mL/hr over 60 Minutes Intravenous  Once 08/28/20 1222 08/28/20 2001   08/28/20 1000  cefTRIAXone (ROCEPHIN) 1 g in sodium chloride 0.9 % 100 mL IVPB  Status:  Discontinued        1 g 200 mL/hr over 30 Minutes Intravenous Every 24 hours 08/27/20 1234 08/27/20 1321    08/28/20 1000  cefTRIAXone (ROCEPHIN) 2 g in sodium chloride 0.9 % 100 mL IVPB        2 g 200 mL/hr over 30 Minutes Intravenous Every 24 hours 08/27/20 1321     08/28/20 0300  vancomycin (VANCOCIN) IVPB 1000 mg/200 mL premix  Status:  Discontinued  1,000 mg 200 mL/hr over 60 Minutes Intravenous Every 12 hours 08/27/20 1321 08/28/20 1222   08/27/20 1330  vancomycin (VANCOCIN) IVPB 1000 mg/200 mL premix  Status:  Discontinued        1,000 mg 200 mL/hr over 60 Minutes Intravenous  Once 08/27/20 1219 08/27/20 1226   08/27/20 1330  vancomycin (VANCOREADY) IVPB 1500 mg/300 mL        1,500 mg 150 mL/hr over 120 Minutes Intravenous  Once 08/27/20 1226 08/27/20 1813   08/27/20 1215  vancomycin (VANCOCIN) IVPB 1000 mg/200 mL premix        1,000 mg 200 mL/hr over 60 Minutes Intravenous  Once 08/27/20 1202 08/27/20 1813   08/27/20 1215  cefTRIAXone (ROCEPHIN) 2 g in sodium chloride 0.9 % 100 mL IVPB        2 g 200 mL/hr over 30 Minutes Intravenous  Once 08/27/20 1202 08/27/20 1326      REVIEW OF SYSTEMS:  Const: negative fever, negative chills, negative weight loss Eyes: negative diplopia or visual changes, negative eye pain ENT: negative coryza, negative sore throat Resp: negative cough, hemoptysis, dyspnea Cards: negative for chest pain, palpitations, lower extremity edema GU: negative for frequency, dysuria and hematuria GI: Negative for abdominal pain, diarrhea, bleeding, constipation Skin: negative for rash and pruritus Heme: negative for easy bruising and gum/nose bleeding MS: as above Neurolo:negative for headaches, dizziness, vertigo, memory problems  Psych: negative for feelings of anxiety, depression  Endocrine: negative for thyroid, diabetes Allergy/Immunology- erythromycin Objective:  VITALS:  BP 134/66 (BP Location: Right Arm)   Pulse 69   Temp 98.5 F (36.9 C) (Oral)   Resp 16   Ht 6' (1.829 m)   Wt 122.5 kg   SpO2 99%   BMI 36.62 kg/m  PHYSICAL EXAM:    General: Alert, cooperative, no distress, appears stated age.  Head: Normocephalic, without obvious abnormality, atraumatic. Eyes: Conjunctivae clear, anicteric sclerae. Pupils are equal ENT Nares normal. No drainage or sinus tenderness. Lips, mucosa, and tongue normal. No Thrush Neck: Supple, symmetrical, no adenopathy, thyroid: non tender no carotid bruit and no JVD. Back: No CVA tenderness. Lungs: Clear to auscultation bilaterally. No Wheezing or Rhonchi. No rales. Heart: Regular rate and rhythm, no murmur, rub or gallop. Abdomen: Soft, non-tender,not distended. Bowel sounds normal. No masses Extremities: 08/29/20      08/27/20     Skin: No rashes or lesions. Or bruising Lymph: Cervical, supraclavicular normal. Neurologic: Grossly non-focal Pertinent Labs Lab Results CBC    Component Value Date/Time   WBC 9.3 08/29/2020 0351   RBC 4.26 08/29/2020 0351   HGB 12.5 (L) 08/29/2020 0351   HGB 13.8 06/08/2013 0618   HCT 36.9 (L) 08/29/2020 0351   HCT 42.6 06/08/2013 0618   PLT 279 08/29/2020 0351   PLT 261 06/08/2013 0618   MCV 86.6 08/29/2020 0351   MCV 87 06/08/2013 0618   MCH 29.3 08/29/2020 0351   MCHC 33.9 08/29/2020 0351   RDW 12.3 08/29/2020 0351   RDW 14.5 06/08/2013 0618   LYMPHSABS 1.4 08/27/2020 1015   MONOABS 0.9 08/27/2020 1015   EOSABS 0.1 08/27/2020 1015   BASOSABS 0.0 08/27/2020 1015    CMP Latest Ref Rng & Units 08/29/2020 08/28/2020 08/27/2020  Glucose 70 - 99 mg/dL 246(H) 158(H) 232(H)  BUN 6 - 20 mg/dL 23(H) 21(H) 23(H)  Creatinine 0.61 - 1.24 mg/dL 1.18 1.09 1.48(H)  Sodium 135 - 145 mmol/L 136 136 133(L)  Potassium 3.5 - 5.1 mmol/L 4.7 4.3 4.5  Chloride  98 - 111 mmol/L 104 104 99  CO2 22 - 32 mmol/L 22 23 21(L)  Calcium 8.9 - 10.3 mg/dL 8.9 8.9 9.6  Total Protein 6.5 - 8.1 g/dL - - 8.5(H)  Total Bilirubin 0.3 - 1.2 mg/dL - - 1.0  Alkaline Phos 38 - 126 U/L - - 49  AST 15 - 41 U/L - - 17  ALT 0 - 44 U/L - - 20       Microbiology: Recent Results (from the past 240 hour(s))  Aerobic Culture (superficial specimen)     Status: None (Preliminary result)   Collection Time: 08/27/20 10:00 AM   Specimen: Wound  Result Value Ref Range Status   Specimen Description   Final    WOUND Performed at [facility], [address], [place], [place] [address]    Special Requests   Final    LEFT FOOT Performed at [facility], [address] [place], [place] [address]    Gram Stain   Final    NO WBC SEEN RARE GRAM POSITIVE COCCI Performed at [facility], [place] [address] [place], [place] [address]    Culture MODERATE STAPHYLOCOCCUS AUREUS  Final   Report Status PENDING  Incomplete   Organism ID, Bacteria STAPHYLOCOCCUS AUREUS  Final      Susceptibility   Staphylococcus aureus - MIC*    CIPROFLOXACIN <=0.5 SENSITIVE Sensitive     ERYTHROMYCIN >=8 RESISTANT Resistant     GENTAMICIN <=0.5 SENSITIVE Sensitive     OXACILLIN <=0.25 SENSITIVE Sensitive     TETRACYCLINE <=1 SENSITIVE Sensitive     VANCOMYCIN 1 SENSITIVE Sensitive     TRIMETH/SULFA <=10 SENSITIVE Sensitive     CLINDAMYCIN <=0.25 SENSITIVE Sensitive     RIFAMPIN <=0.5 SENSITIVE Sensitive     Inducible Clindamycin NEGATIVE Sensitive     * MODERATE STAPHYLOCOCCUS AUREUS  Blood culture (routine x 2)     Status: None (Preliminary result)   Collection Time: 08/27/20 10:15 AM   Specimen: BLOOD RIGHT ARM  Result Value Ref Range Status   Specimen Description BLOOD RIGHT ARM  Final   Special Requests   Final    BOTTLES DRAWN AEROBIC AND ANAEROBIC Blood Culture adequate volume   Culture   Final    NO GROWTH 2 DAYS Performed at [facility], [address] [place], [place] [address]    Report Status PENDING  Incomplete  Blood culture (routine x 2)     Status: None (Preliminary result)   Collection Time: 08/27/20 12:25 PM   Specimen: BLOOD  Result Value Ref Range Status   Specimen Description BLOOD BLOOD  LEFT HAND  Final   Special Requests   Final    BOTTLES DRAWN AEROBIC AND ANAEROBIC Blood Culture results may not be optimal due to an excessive volume of blood received in culture bottles   Culture   Final    NO GROWTH 2 DAYS Performed at [facility], [address], [place], [place] [address]    Report Status PENDING  Incomplete  Respiratory Panel by RT PCR (Flu A&B, Covid) - Nasopharyngeal Swab     Status: None   Collection Time: 08/27/20 12:25 PM   Specimen: Nasopharyngeal Swab  Result Value Ref Range Status   SARS Coronavirus 2 by RT PCR NEGATIVE NEGATIVE Final    Comment: (NOTE) SARS-CoV-2 target nucleic acids are NOT DETECTED.  The SARS-CoV-2 RNA is generally detectable in upper respiratoy specimens during the acute phase of infection. The lowest  concentration of SARS-CoV-2 viral copies this assay can detect is 131 copies/mL. A negative result does not preclude SARS-Cov-2 infection and should not be used as the sole basis for treatment or other patient management decisions. A negative result may occur with  improper specimen collection/handling, submission of specimen other than nasopharyngeal swab, presence of viral mutation(s) within the areas targeted by this assay, and inadequate number of viral copies (<131 copies/mL). A negative result must be combined with clinical observations, patient history, and epidemiological information. The expected result is Negative.  Fact Sheet for Patients:  PinkCheek.be  Fact Sheet for Healthcare Providers:  GravelBags.it  This test is no t yet approved or cleared by the [place] FDA and  has been authorized for detection and/or diagnosis of SARS-CoV-2 by FDA under an Emergency Use Authorization (EUA). This EUA will remain  in effect (meaning this test can be used) for the duration of the COVID-19 declaration under Section 564(b)(1) of the Act, 21 U.S.C. section  360bbb-3(b)(1), unless the authorization is terminated or revoked sooner.     Influenza A by PCR NEGATIVE NEGATIVE Final   Influenza B by PCR NEGATIVE NEGATIVE Final    Comment: (NOTE) The Xpert Xpress SARS-CoV-2/FLU/RSV assay is intended as an aid in  the diagnosis of influenza from Nasopharyngeal swab specimens and  should not be used as a sole basis for treatment. Nasal washings and  aspirates are unacceptable for Xpert Xpress SARS-CoV-2/FLU/RSV  testing.  Fact Sheet for Patients: PinkCheek.be  Fact Sheet for Healthcare Providers: GravelBags.it  This test is not yet approved or cleared by the [place] FDA and  has been authorized for detection and/or diagnosis of SARS-CoV-2 by  FDA under an Emergency Use Authorization (EUA). This EUA will remain  in effect (meaning this test can be used) for the duration of the  Covid-19 declaration under Section 564(b)(1) of the Act, 21  U.S.C. section 360bbb-3(b)(1), unless the authorization is  terminated or revoked. Performed at [facility], [address] [place], [place] [address]   Surgical pcr screen     Status: Abnormal   Collection Time: 08/28/20 11:24 AM   Specimen: Nasal Mucosa; Nasal Swab  Result Value Ref Range Status   MRSA, PCR NEGATIVE NEGATIVE Final   Staphylococcus aureus POSITIVE (A) NEGATIVE Final    Comment: (NOTE) The Xpert SA Assay (FDA approved for NASAL specimens in patients 41 years of age and older), is one component of a comprehensive surveillance program. It is not intended to diagnose infection nor to guide or monitor treatment. Performed at [facility], [address] [place], [place] [address]   Aerobic/Anaerobic Culture (surgical/deep wound)     Status: None (Preliminary result)   Collection Time: 08/28/20  4:27 PM   Specimen: PATH Amputaion Arm/Leg; Tissue  Result Value Ref Range Status   Specimen Description    Final    ABSCESS LEFT FOOT Performed at [facility], [address], [place], [place] [address]    Special Requests   Final    NONE Performed at [facility], [address] [place], [place] [address]    Gram Stain   Final    FEW WBC PRESENT,BOTH PMN AND MONONUCLEAR RARE GRAM NEGATIVE RODS RARE GRAM POSITIVE COCCI    Culture   Final    TOO YOUNG TO READ Performed at [facility], [place] [address], [place], [place] [address]    Report Status PENDING  Incomplete    IMAGING RESULTS: I  have personally reviewed the films ? Impression/Recommendation ? ?Left foot infection s/p I/D of the plantar abscess at the MTP area- preliminary wound culture was MSSA. Surgical culture pending Seen by Dr,Folwler who recommends Po antibiotic on discharge- if surgical culture remains neg tomorrow or grows MSSA he can either be switched to Po augmentin BID or PO dicloxacillin  (adjust dose according to crcl_).for [redacted] weeks along with probiotic if gram neg grows in culture antibitoic will have to be adjusted accordingly- Antimicrobial stewardship pharmacist can advise regarding antibiotic choice.   DM- on insulin management as per primary team ? _AKI-improving _________________________________________________ Discussed with patient, requesting provider Dr.Fitzgerald will cover ID tomorrow - call him if needed .

## 2020-08-30 LAB — AEROBIC CULTURE W GRAM STAIN (SUPERFICIAL SPECIMEN): Gram Stain: NONE SEEN

## 2020-08-30 LAB — GLUCOSE, CAPILLARY
Glucose-Capillary: 116 mg/dL — ABNORMAL HIGH (ref 70–99)
Glucose-Capillary: 200 mg/dL — ABNORMAL HIGH (ref 70–99)

## 2020-08-30 MED ORDER — METFORMIN HCL 500 MG PO TABS: 1000.0000 mg | ORAL_TABLET | Freq: Two times a day (BID) | ORAL | 0 refills | Status: AC

## 2020-08-30 MED ORDER — RISAQUAD PO CAPS: 1.0000 | ORAL_CAPSULE | Freq: Every day | ORAL | Status: DC

## 2020-08-30 MED ORDER — LISINOPRIL 20 MG PO TABS
20.0000 mg | ORAL_TABLET | Freq: Every day | ORAL | Status: DC
  Administered 2020-08-30: 20 mg via ORAL
  Filled 2020-08-30: qty 1

## 2020-08-30 MED ORDER — AMOXICILLIN-POT CLAVULANATE 875-125 MG PO TABS: 1.0000 | ORAL_TABLET | Freq: Two times a day (BID) | ORAL | 0 refills | Status: AC

## 2020-08-30 MED ORDER — RISAQUAD PO CAPS: 1.0000 | ORAL_CAPSULE | Freq: Every day | ORAL | 0 refills | Status: AC

## 2020-08-30 MED ORDER — LISINOPRIL-HYDROCHLOROTHIAZIDE 20-25 MG PO TABS: 1.0000 | ORAL_TABLET | Freq: Every day | ORAL | Status: DC

## 2020-08-30 MED ORDER — RISAQUAD PO CAPS
1.0000 | ORAL_CAPSULE | Freq: Every day | ORAL | Status: DC
  Administered 2020-08-30: 1 via ORAL
  Filled 2020-08-30: qty 1

## 2020-08-30 MED ORDER — AMOXICILLIN-POT CLAVULANATE 875-125 MG PO TABS
1.0000 | ORAL_TABLET | Freq: Two times a day (BID) | ORAL | Status: DC
  Administered 2020-08-30: 1 via ORAL
  Filled 2020-08-30: qty 1

## 2020-08-30 MED ORDER — HYDROCHLOROTHIAZIDE 25 MG PO TABS
25.0000 mg | ORAL_TABLET | Freq: Every day | ORAL | Status: DC
  Administered 2020-08-30: 25 mg via ORAL
  Filled 2020-08-30: qty 1

## 2020-08-30 NOTE — Discharge Instructions (Signed)
Diabetes Mellitus and Foot Care Foot care is an important part of your health, especially when you have diabetes. Diabetes may cause you to have problems because of poor blood flow (circulation) to your feet and legs, which can cause your skin to:  Become thinner and drier.  Break more easily.  Heal more slowly.  Peel and crack. You may also have nerve damage (neuropathy) in your legs and feet, causing decreased feeling in them. This means that you may not notice minor injuries to your feet that could lead to more serious problems. Noticing and addressing any potential problems early is the best way to prevent future foot problems. How to care for your feet Foot hygiene  Wash your feet daily with warm water and mild soap. Do not use hot water. Then, pat your feet and the areas between your toes until they are completely dry. Do not soak your feet as this can dry your skin.  Trim your toenails straight across. Do not dig under them or around the cuticle. File the edges of your nails with an emery board or nail file.  Apply a moisturizing lotion or petroleum jelly to the skin on your feet and to dry, brittle toenails. Use lotion that does not contain alcohol and is unscented. Do not apply lotion between your toes. Shoes and socks  Wear clean socks or stockings every day. Make sure they are not too tight. Do not wear knee-high stockings since they may decrease blood flow to your legs.  Wear shoes that fit properly and have enough cushioning. Always look in your shoes before you put them on to be sure there are no objects inside.  To break in new shoes, wear them for just a few hours a day. This prevents injuries on your feet. Wounds, scrapes, corns, and calluses  Check your feet daily for blisters, cuts, bruises, sores, and redness. If you cannot see the bottom of your feet, use a mirror or ask someone for help.  Do not cut corns or calluses or try to remove them with medicine.  If you  find a minor scrape, cut, or break in the skin on your feet, keep it and the skin around it clean and dry. You may clean these areas with mild soap and water. Do not clean the area with peroxide, alcohol, or iodine.  If you have a wound, scrape, corn, or callus on your foot, look at it several times a day to make sure it is healing and not infected. Check for: ? Redness, swelling, or pain. ? Fluid or blood. ? Warmth. ? Pus or a bad smell. General instructions  Do not cross your legs. This may decrease blood flow to your feet.  Do not use heating pads or hot water bottles on your feet. They may burn your skin. If you have lost feeling in your feet or legs, you may not know this is happening until it is too late.  Protect your feet from hot and cold by wearing shoes, such as at the beach or on hot pavement.  Schedule a complete foot exam at least once a year (annually) or more often if you have foot problems. If you have foot problems, report any cuts, sores, or bruises to your health care provider immediately. Contact a health care provider if:  You have a medical condition that increases your risk of infection and you have any cuts, sores, or bruises on your feet.  You have an injury that is not   healing.  You have redness on your legs or feet.  You feel burning or tingling in your legs or feet.  You have pain or cramps in your legs and feet.  Your legs or feet are numb.  Your feet always feel cold.  You have pain around a toenail. Get help right away if:  You have a wound, scrape, corn, or callus on your foot and: ? You have pain, swelling, or redness that gets worse. ? You have fluid or blood coming from the wound, scrape, corn, or callus. ? Your wound, scrape, corn, or callus feels warm to the touch. ? You have pus or a bad smell coming from the wound, scrape, corn, or callus. ? You have a fever. ? You have a red line going up your leg. Summary  Check your feet every day  for cuts, sores, red spots, swelling, and blisters.  Moisturize feet and legs daily.  Wear shoes that fit properly and have enough cushioning.  If you have foot problems, report any cuts, sores, or bruises to your health care provider immediately.  Schedule a complete foot exam at least once a year (annually) or more often if you have foot problems. This information is not intended to replace advice given to you by your health care provider. Make sure you discuss any questions you have with your health care provider. Document Revised: 07/26/2019 Document Reviewed: 12/04/2016 Elsevier Patient Education  2020 [employer]  

## 2020-08-30 NOTE — Discharge Summary (Signed)
Discharge Summary  Chris Kline:[id number] DOB: Aug 24, 1966  PCP: Sallee Lange, NP  Admit date: 08/27/2020 Discharge date: 08/30/2020  Time spent: 35 minutes  Recommendations for Outpatient Follow-up:  1. Follow-up with podiatry in 1 to 2 weeks 2. Follow-up with infectious disease in 1 to 2 weeks 3. Take your medications as prescribed 4. Continue PT OT with assistance and fall precautions   Discharge Diagnoses:  Active Hospital Problems   Diagnosis Date Noted  . Left foot infection 08/27/2020  . Diabetic foot ulcers ([place]) 08/27/2020  . Sepsis ([place]) 08/27/2020  . HTN (hypertension) 08/27/2020  . AKI (acute kidney injury) ([place]) 08/27/2020  . Diabetes mellitus without complication ([place]) 31/51/7616    Resolved Hospital Problems  No resolved problems to display.    Discharge Condition: Stable  Diet recommendation: Heart healthy carb modified diet.  Vitals:   08/30/20 0124 08/30/20 0806  BP: 134/79 (!) 143/89  Pulse: 64 67  Resp: 18 16  Temp: (!) 97.5 F (36.4 C) 98.4 F (36.9 C)  SpO2: 99% 99%    History of present illness:  Chris Kawabata Robertsonis a 54 y.o.malewith medical history significant ofHTN, DM,kidney stone, who presents with a left foot ulcer with infection.  Pt states that initially he noted smallleft foot plantarblister under 3 weeks ago. Thisulcerhas been progressively worseningand enlarging, started draining pus.Right foot become swelling with erythema surrounding foot ulcer. He has mild foot pain.Patient denies fever or chills. No chest pain, shortness breath, cough. Denies nausea vomiting, diarrhea, abdominal pain, symptoms of UTI or unilateral weakness. Pt was seen by Dr. Cleda Mccreedy ofpodiatryinoffice, and reportedly had x-ray done, which did not show clear evidence of osteomyelitis.  ED Course:pt was found to have WBC 13.2, lactic acid 2.8, pending COVID-19 PCR, AKI with creatinine 1.48, BUN 23, temperature 98.8, blood  pressure 143/83, initially tachycardia with heart rate 91 which improved to 87 after giving IV fluid, RR 17,oxygen saturation 99% on room air. Patient is admitted to [facility] bed as inpatient. Podiatry, Dr. Vickki Muff is consulted.  Post I&D on 08/28/20 by Dr. Vickki Muff.  08/30/20:   Seen in his room.  No acute events overnight.  He has no new complaints.  He is eager to go home.  Hospital Course:  Principal Problem:   Left foot infection Active Problems:   Diabetic foot ulcers (HCC)   Sepsis ([place])   HTN (hypertension)   AKI (acute kidney injury) ([place])   Diabetes mellitus without complication ([place])  Left foot diabetic foot ulcer with infection and abscess post I&D on 08/28/20 Presented with WBC 13.2K Started on IV antibiotics empirically IV vancomycin and Rocephin. Switched to linezolid on 08/28/2020 then to Unasyn 08/29/2020 Augmentin started on 08/30/20 x 2 weeks as recommended by infectious disease. Take a daily probiotic as recommended by infectious disease Leukocytosis has resolved.  Afebrile Deep wound gram stain, polymicrobial; deep wound culture, too young to read. Will follow up with infectious disease and podiatry outpatient.  Patient understands and agrees to plan.  Type 2 diabetes with hyperglycemia Hemoglobin A1c 6.7 on 08/27/20 Resume oral hypoglycemics Follow-up with your PCP  Polyneuropathy Continue home regimen  Essential hypertension BP stable Continue home antihypertensives     Code Status: Full    Consultants:  Podiatry  Procedures:  Planned I&D this afternoon.  Antimicrobials:  Linezolid with Rocephin  Unasyn  Switched to Augmentin on ten 1521 x 2 weeks as recommended by infectious disease.     Procedures: Left foot diabetic foot ulcer with infection and  abscess post I&D on 08/28/20  Consultations:  Podiatry  Infectious disease.  Discharge Exam: BP (!) 143/89 (BP Location: Right Arm)   Pulse 67   Temp 98.4 F  (36.9 C) (Oral)   Resp 16   Ht 6' (1.829 m)   Wt 122.5 kg   SpO2 99%   BMI 36.62 kg/m  . General: 54 y.o. year-old male well developed well nourished in no acute distress.  Alert and oriented x3. . Cardiovascular: Regular rate and rhythm with no rubs or gallops.  No thyromegaly or JVD noted.   Marland Kitchen Respiratory: Clear to auscultation with no wheezes or rales. Good inspiratory effort. . Abdomen: Soft nontender nondistended with normal bowel sounds x4 quadrants. . Musculoskeletal: Left foot in surgical dressing. Marland Kitchen Psychiatry: Mood is appropriate for condition and setting  Discharge Instructions You were cared for by a hospitalist during your hospital stay. If you have any questions about your discharge medications or the care you received while you were in the hospital after you are discharged, you can call the unit and asked to speak with the hospitalist on call if the hospitalist that took care of you is not available. Once you are discharged, your primary care physician will handle any further medical issues. Please note that NO REFILLS for any discharge medications will be authorized once you are discharged, as it is imperative that you return to your primary care physician (or establish a relationship with a primary care physician if you do not have one) for your aftercare needs so that they can reassess your need for medications and monitor your lab values.   Allergies as of 08/30/2020      Reactions   Erythromycin Other (See Comments)   Abdominal pain      Medication List    TAKE these medications   acidophilus Caps capsule Take 1 capsule by mouth daily for 20 days.   amoxicillin-clavulanate 875-125 MG tablet Commonly known as: AUGMENTIN Take 1 tablet by mouth every 12 (twelve) hours for 14 days.   Cholecalciferol 50 MCG (2000 UT) Tabs Take 1 tablet by mouth daily.   lisinopril-hydrochlorothiazide 20-25 MG tablet Commonly known as: ZESTORETIC Take 1 tablet by mouth daily.     metFORMIN 500 MG tablet Commonly known as: GLUCOPHAGE Take 2 tablets (1,000 mg total) by mouth 2 (two) times daily with a meal. What changed: how much to take      Allergies  Allergen Reactions  . Erythromycin Other (See Comments)    Abdominal pain    Follow-up Information    Gauger, Victoriano Lain, NP. Call in 1 day.   Specialty: Internal Medicine Why: Please call for a post hospital follow up appointment; October 20th @ 2pm with Dr. Toni Arthurs Contact information: [place] [address] [phone]        Samara Deist, [place]. Call in 1 day.   Specialty: Podiatry Why: Please call for a post hospital follow up appointment; October 21 @ 3:15p Contact information: [place] [address] [phone]        Tsosie Billing, MD. Call in 1 day.   Specialty: Infectious Diseases Why: PLease call for a post hospital follow up appointment; clinic will call patient to schedule appointment Contact information: [place] [address] [phone]                The results of significant diagnostics from this hospitalization (including imaging, microbiology, ancillary and laboratory) are listed below for  reference.    Significant Diagnostic Studies: DG Eye Foreign Body  Result Date: 08/27/2020 CLINICAL DATA:  Metal working/exposure; clearance prior to MRI EXAM: ORBITS FOR FOREIGN BODY - 2 VIEW COMPARISON:  None. FINDINGS: There is no evidence of metallic foreign body within the orbits. No significant bone abnormality identified. IMPRESSION: No evidence of metallic foreign body within the orbits. Electronically Signed   By: Franchot Gallo M.D.   On: 08/27/2020 13:24   MR FOOT LEFT W WO CONTRAST  Result Date: 08/27/2020 CLINICAL DATA:  Diabetic with foot swelling and enlarging plantar foot wound for 3 weeks. The wound was drained in the clinic. Clinical concern for osteomyelitis. EXAM: MRI OF THE LEFT  FOREFOOT WITHOUT AND WITH CONTRAST TECHNIQUE: Multiplanar, multisequence MR imaging of the left forefoot was performed both before and after administration of intravenous contrast. CONTRAST:  64mL GADAVIST GADOBUTROL 1 MMOL/ML IV SOLN COMPARISON:  None. FINDINGS: Despite efforts by the technologist and patient, mild motion artifact is present on today's exam and could not be eliminated. This reduces exam sensitivity and specificity. Bones/Joint/Cartilage No evidence of acute fracture, dislocation or osteomyelitis. Specifically, no suspicious marrow signal, abnormal enhancement or cortical destruction. Mild degenerative changes within the sesamoids of the 1st metatarsal. No significant joint effusions or suspicious synovial enhancement. Ligaments The Lisfranc ligament is intact. The collateral ligaments of the metatarsophalangeal joints are intact. Muscles and Tendons Intact forefoot tendons without significant tenosynovitis. Diffuse T2 hyperintensity and low level enhancement throughout the forefoot musculature consistent with diabetic myopathy. No focal intramuscular fluid collections. Soft tissues Focal skin ulceration plantar to the 1st metatarsophalangeal joint consistent with the patient's ulcer. There is an underlying heterogeneous fluid collection or phlegmon which abuts the sesamoids, measuring 2.8 x 2.6 x 1.6 cm. There is generalized poor enhancement of the subcutaneous fat in the ball of the foot. No other focal fluid collections are seen. No evidence of soft tissue emphysema or foreign body. There is a small pressure lesion plantar to the 5th MTP joint. IMPRESSION: 1. Focal skin ulceration plantar to the 1st metatarsophalangeal joint consistent with the patient's ulcer. There is an underlying small abscess or phlegmon which abuts the sesamoids, measuring 2.8 x 2.6 x 1.6 cm. 2. No evidence of osteomyelitis or septic joint. 3. Diffuse poor enhancement of the subcutaneous fat in the ball of the foot  consistent with poor perfusion and possible devitalized tissue. Electronically Signed   By: Richardean Sale M.D.   On: 08/27/2020 14:40    Microbiology: Recent Results (from the past 240 hour(s))  Aerobic Culture (superficial specimen)     Status: None (Preliminary result)   Collection Time: 08/27/20 10:00 AM   Specimen: Wound  Result Value Ref Range Status   Specimen Description   Final    WOUND Performed at [facility], [address] [place], [place] [address]    Special Requests   Final    LEFT FOOT Performed at [facility], [address] [place], [place] [address]    Gram Stain   Final    NO WBC SEEN RARE GRAM POSITIVE COCCI Performed at [facility], [place] [address], [place], [place] [address]    Culture MODERATE STAPHYLOCOCCUS AUREUS  Final   Report Status PENDING  Incomplete   Organism ID, Bacteria STAPHYLOCOCCUS AUREUS  Final      Susceptibility   Staphylococcus aureus - MIC*    CIPROFLOXACIN <=0.5 SENSITIVE Sensitive     ERYTHROMYCIN >=8 RESISTANT Resistant     GENTAMICIN <=  0.5 SENSITIVE Sensitive     OXACILLIN <=0.25 SENSITIVE Sensitive     TETRACYCLINE <=1 SENSITIVE Sensitive     VANCOMYCIN 1 SENSITIVE Sensitive     TRIMETH/SULFA <=10 SENSITIVE Sensitive     CLINDAMYCIN <=0.25 SENSITIVE Sensitive     RIFAMPIN <=0.5 SENSITIVE Sensitive     Inducible Clindamycin NEGATIVE Sensitive     * MODERATE STAPHYLOCOCCUS AUREUS  Blood culture (routine x 2)     Status: None (Preliminary result)   Collection Time: 08/27/20 10:15 AM   Specimen: BLOOD RIGHT ARM  Result Value Ref Range Status   Specimen Description BLOOD RIGHT ARM  Final   Special Requests   Final    BOTTLES DRAWN AEROBIC AND ANAEROBIC Blood Culture adequate volume   Culture   Final    NO GROWTH 3 DAYS Performed at [facility], [address], [place], [place] [address]    Report Status PENDING  Incomplete  Blood culture (routine x 2)     Status: None (Preliminary  result)   Collection Time: 08/27/20 12:25 PM   Specimen: BLOOD  Result Value Ref Range Status   Specimen Description BLOOD BLOOD LEFT HAND  Final   Special Requests   Final    BOTTLES DRAWN AEROBIC AND ANAEROBIC Blood Culture results may not be optimal due to an excessive volume of blood received in culture bottles   Culture   Final    NO GROWTH 3 DAYS Performed at [facility], [address] [place], [place] [address]    Report Status PENDING  Incomplete  Respiratory Panel by RT PCR (Flu A&B, Covid) - Nasopharyngeal Swab     Status: None   Collection Time: 08/27/20 12:25 PM   Specimen: Nasopharyngeal Swab  Result Value Ref Range Status   SARS Coronavirus 2 by RT PCR NEGATIVE NEGATIVE Final    Comment: (NOTE) SARS-CoV-2 target nucleic acids are NOT DETECTED.  The SARS-CoV-2 RNA is generally detectable in upper respiratoy specimens during the acute phase of infection. The lowest concentration of SARS-CoV-2 viral copies this assay can detect is 131 copies/mL. A negative result does not preclude SARS-Cov-2 infection and should not be used as the sole basis for treatment or other patient management decisions. A negative result may occur with  improper specimen collection/handling, submission of specimen other than nasopharyngeal swab, presence of viral mutation(s) within the areas targeted by this assay, and inadequate number of viral copies (<131 copies/mL). A negative result must be combined with clinical observations, patient history, and epidemiological information. The expected result is Negative.  Fact Sheet for Patients:  PinkCheek.be  Fact Sheet for Healthcare Providers:  GravelBags.it  This test is no t yet approved or cleared by the [place] FDA and  has been authorized for detection and/or diagnosis of SARS-CoV-2 by FDA under an Emergency Use Authorization (EUA). This EUA will remain  in effect  (meaning this test can be used) for the duration of the COVID-19 declaration under Section 564(b)(1) of the Act, 21 U.S.C. section 360bbb-3(b)(1), unless the authorization is terminated or revoked sooner.     Influenza A by PCR NEGATIVE NEGATIVE Final   Influenza B by PCR NEGATIVE NEGATIVE Final    Comment: (NOTE) The Xpert Xpress SARS-CoV-2/FLU/RSV assay is intended as an aid in  the diagnosis of influenza from Nasopharyngeal swab specimens and  should not be used as a sole basis for treatment. Nasal washings and  aspirates are unacceptable for Xpert Xpress SARS-CoV-2/FLU/RSV  testing.  Fact Sheet for  Patients: PinkCheek.be  Fact Sheet for Healthcare Providers: GravelBags.it  This test is not yet approved or cleared by the [place] FDA and  has been authorized for detection and/or diagnosis of SARS-CoV-2 by  FDA under an Emergency Use Authorization (EUA). This EUA will remain  in effect (meaning this test can be used) for the duration of the  Covid-19 declaration under Section 564(b)(1) of the Act, 21  U.S.C. section 360bbb-3(b)(1), unless the authorization is  terminated or revoked. Performed at [facility], [address] [place], [place] [address]   Surgical pcr screen     Status: Abnormal   Collection Time: 08/28/20 11:24 AM   Specimen: Nasal Mucosa; Nasal Swab  Result Value Ref Range Status   MRSA, PCR NEGATIVE NEGATIVE Final   Staphylococcus aureus POSITIVE (A) NEGATIVE Final    Comment: (NOTE) The Xpert SA Assay (FDA approved for NASAL specimens in patients 37 years of age and older), is one component of a comprehensive surveillance program. It is not intended to diagnose infection nor to guide or monitor treatment. Performed at [facility], [address] [place], [place] [address]   Aerobic/Anaerobic Culture (surgical/deep wound)     Status: None (Preliminary result)    Collection Time: 08/28/20  4:27 PM   Specimen: PATH Amputaion Arm/Leg; Tissue  Result Value Ref Range Status   Specimen Description   Final    ABSCESS LEFT FOOT Performed at [facility], [address], [place], [place] [address]    Special Requests   Final    NONE Performed at [facility], [address] [place], [place] [address]    Gram Stain   Final    FEW WBC PRESENT,BOTH PMN AND MONONUCLEAR RARE GRAM NEGATIVE RODS RARE GRAM POSITIVE COCCI    Culture   Final    TOO YOUNG TO READ Performed at [facility], [address], [place], [place] [address]    Report Status PENDING  Incomplete     Labs: Basic Metabolic Panel: Recent Labs  Lab 08/27/20 1015 08/28/20 0445 08/29/20 0351  NA 133* 136 136  K 4.5 4.3 4.7  CL 99 104 104  CO2 21* 23 22  GLUCOSE 232* 158* 246*  BUN 23* 21* 23*  CREATININE 1.48* 1.09 1.18  CALCIUM 9.6 8.9 8.9   Liver Function Tests: Recent Labs  Lab 08/27/20 1015  AST 17  ALT 20  ALKPHOS 49  BILITOT 1.0  PROT 8.5*  ALBUMIN 4.3   No results for input(s): LIPASE, AMYLASE in the last 168 hours. No results for input(s): AMMONIA in the last 168 hours. CBC: Recent Labs  Lab 08/27/20 1015 08/28/20 0445 08/29/20 0351  WBC 13.2* 10.1 9.3  NEUTROABS 10.7*  --   --   HGB 13.5 11.7* 12.5*  HCT 40.0 34.7* 36.9*  MCV 86.4 86.3 86.6  PLT 281 233 279   Cardiac Enzymes: No results for input(s): CKTOTAL, CKMB, CKMBINDEX, TROPONINI in the last 168 hours. BNP: BNP (last 3 results) No results for input(s): BNP in the last 8760 hours.  ProBNP (last 3 results) No results for input(s): PROBNP in the last 8760 hours.  CBG: Recent Labs  Lab 08/29/20 0732 08/29/20 1138 08/29/20 1630 08/29/20 2110 08/30/20 0807  GLUCAP 172* 200* 235* 201* 116*       Signed:  Kayleen Memos, MD [facility] 08/30/2020, 11:09 AM

## 2020-08-30 NOTE — TOC Initial Note (Signed)
Transition of Care [facility]) - Initial/Assessment Note    Patient Details  Name: Chris Kline MRN: [id number] Date of Birth: 04-Aug-1966  [facility]) CM/SW Contact:    Shelbie Ammons, RN Phone Number: 08/30/2020, 10:39 AM  Clinical Narrative:    RNCM met with patient at bedside. Patient sitting up to side of bed, reports to feeling well this morning and feels ready to go home. Patient reports that he will plan on going home and already has the necessary equipment. Patient is in agreement to having home health set up and does not have preference to agency.  RNCM reached out to [facility] with [facility] and he will see if he can accept referral.                Expected Discharge Plan: [place] Barriers to Discharge: No Barriers Identified   Patient Goals and CMS Choice        Expected Discharge Plan and Services Expected Discharge Plan: [place] arrangements for the past 2 months: Single Family Home Expected Discharge Date: 08/30/20                         HH Arranged: RN, PT [place] Agency: [place] ([place]) Date HH Agency Contacted: 08/30/20 Time HH Agency Contacted: 24 Representative spoke with at [place]: Corene Cornea  Prior Living Arrangements/Services Living arrangements for the past 2 months: [place] Lives with:: Parents Patient language and need for interpreter reviewed:: Yes Do you feel safe going back to the place where you live?: No      Need for Family Participation in Patient Care: Yes (Comment) Care giver support system in place?: Yes (comment)   Criminal Activity/Legal Involvement Pertinent to Current Situation/Hospitalization: No - Comment as needed  Activities of Daily Living Home Assistive Devices/Equipment: None ADL Screening (condition at time of admission) Patient's cognitive ability adequate to safely complete daily activities?: Yes Is  the patient deaf or have difficulty hearing?: No Does the patient have difficulty seeing, even when wearing glasses/contacts?: No Does the patient have difficulty concentrating, remembering, or making decisions?: No Patient able to express need for assistance with ADLs?: Yes Does the patient have difficulty dressing or bathing?: No Independently performs ADLs?: Yes (appropriate for developmental age) Does the patient have difficulty walking or climbing stairs?: No Weakness of Legs: None Weakness of Arms/Hands: None  Permission Sought/Granted                  Emotional Assessment Appearance:: Appears stated age Attitude/Demeanor/Rapport: Engaged Affect (typically observed): Calm, Appropriate Orientation: : Oriented to Self, Oriented to Place, Oriented to  Time, Oriented to Situation Alcohol / Substance Use: Not Applicable Psych Involvement: No (comment)  Admission diagnosis:  Hyperglycemia [R73.9] Foreign body (FB) in soft tissue [M79.5] Left foot infection [L08.9] Diabetic ulcer of left foot associated with type 2 diabetes mellitus, unspecified part of foot, unspecified ulcer stage ([place]) [F79.024, L97.529] Sepsis, due to unspecified organism, unspecified whether acute organ dysfunction present [facility]) [A41.9] Patient Active Problem List   Diagnosis Date Noted  . Diabetic foot ulcers ([place]) 08/27/2020  . Left foot infection 08/27/2020  . Sepsis ([place]) 08/27/2020  . HTN (hypertension) 08/27/2020  . AKI (acute kidney injury) ([place]) 08/27/2020  . Diabetes mellitus without complication ([place]) 09/73/5329   PCP:  Sallee Lange, NP Pharmacy:   [id number]  [place], [address] [place] [address] Phone: [phone] Fax: [phone]     Social Determinants of Health (SDOH) Interventions    Readmission Risk Interventions No flowsheet data found.

## 2020-08-30 NOTE — Progress Notes (Signed)
Pt seen. Dressig changed.  OK to d/c and should f/u with me in 2 weeks. NWB to foot. Dressings via home health.  OK with 3 x' per week.

## 2020-09-01 LAB — CULTURE, BLOOD (ROUTINE X 2)
Culture: NO GROWTH
Culture: NO GROWTH
Special Requests: ADEQUATE

## 2020-09-02 ENCOUNTER — Telehealth: Payer: Self-pay

## 2020-09-02 LAB — AEROBIC/ANAEROBIC CULTURE W GRAM STAIN (SURGICAL/DEEP WOUND)

## 2020-09-02 NOTE — Telephone Encounter (Signed)
Patient left several messages over the weekend and had to call several doctors to get his antibiotic called in as it was never sent. Patient [facility] I also scheduled his Hosp F/U as [facility] called also to let me know he will need f/u. Patient requested Nov 9 due to transportation and amount of antibiotics he has to take. He has appt with Vickki Muff on 10/22 and I scheduled him for 11/9 for ID.

## 2020-09-24 ENCOUNTER — Encounter: Payer: Self-pay | Admitting: Infectious Diseases

## 2020-09-24 ENCOUNTER — Ambulatory Visit: Payer: BC Managed Care – PPO | Attending: Infectious Diseases | Admitting: Infectious Diseases

## 2020-09-24 DIAGNOSIS — Z872 Personal history of diseases of the skin and subcutaneous tissue: Secondary | ICD-10-CM | POA: Diagnosis not present

## 2020-09-24 DIAGNOSIS — I10 Essential (primary) hypertension: Secondary | ICD-10-CM | POA: Diagnosis not present

## 2020-09-24 DIAGNOSIS — A4901 Methicillin susceptible Staphylococcus aureus infection, unspecified site: Secondary | ICD-10-CM

## 2020-09-24 DIAGNOSIS — B356 Tinea cruris: Secondary | ICD-10-CM | POA: Diagnosis not present

## 2020-09-24 DIAGNOSIS — E669 Obesity, unspecified: Secondary | ICD-10-CM | POA: Insufficient documentation

## 2020-09-24 DIAGNOSIS — E1169 Type 2 diabetes mellitus with other specified complication: Secondary | ICD-10-CM | POA: Insufficient documentation

## 2020-09-24 DIAGNOSIS — E119 Type 2 diabetes mellitus without complications: Secondary | ICD-10-CM | POA: Diagnosis not present

## 2020-09-24 DIAGNOSIS — L089 Local infection of the skin and subcutaneous tissue, unspecified: Secondary | ICD-10-CM

## 2020-09-24 DIAGNOSIS — L02612 Cutaneous abscess of left foot: Secondary | ICD-10-CM | POA: Diagnosis present

## 2020-09-24 DIAGNOSIS — E785 Hyperlipidemia, unspecified: Secondary | ICD-10-CM | POA: Insufficient documentation

## 2020-09-24 MED ORDER — CLOTRIMAZOLE 1 % EX OINT: 1.0000 cm | TOPICAL_OINTMENT | Freq: Two times a day (BID) | CUTANEOUS | 1 refills | Status: DC

## 2020-09-24 NOTE — Progress Notes (Signed)
NAME: Chris Kline  DOB: 01-17-1966  MRN: [id number]  Date/Time: 09/24/2020 10:57 AM   Subjective:  Follow up visit  Ws in the hospital for  left foot infection ? Chris Kline is a 54 y.o. with a history of DM. HTN, renal stone presented to the ED with left foot infection. As per patient he noted gravel in his boot 3 weeks ago while working in the yard and then developed a blister. It got worse with redness and swelling and he went to see the podiatrist on 08/27/20 , a culture was taken and it grew MSSA. He was admitted to the hospital for Iv antibiotics and further debridement. MRI of the foot showed Focal skin ulceration plantar to the 1st metatarsophalangeal joint consistent with the patient's ulcer. There was an underlying small abscess or phlegmon which abuts the sesamoids, measuring 2.8 x2.6 x 1.6 cm. 2. No evidence of osteomyelitis or septic joint. He was initally started on vancomycin and ceftriaxone and the latter changed to linezolid He underwent  Incision and drainage abscess plantar left first MTPJ on 08/28/20. MSSA was present- He was discharged home on PO augmentin on 08/30/20 for a total of 10 days. HE went back to see Dr.Fowler as Op on 10/26  and was given another 10 day course which he completed on 09/20/20 The wound has healed completely- He saw Dr.Fowler today and he removed the stitches No fever, no diarrhea , no rash Blood sugar is around 120-160 On metformin 1000mg  PO BID C/o jock itch  Past Medical History:  Diagnosis Date  . Diabetes mellitus without complication ([place])    type  . History of kidney stones   . Hypertension   . Nephrolithiasis   . Vitamin D deficiency     Past Surgical History:  Procedure Laterality Date  . COLONOSCOPY WITH PROPOFOL N/A 02/02/2018   Procedure: COLONOSCOPY WITH PROPOFOL;  Surgeon: Manya Silvas, MD;  Location: [facility] ENDOSCOPY;  Service: Endoscopy;  Laterality: N/A;  . IRRIGATION AND DEBRIDEMENT FOOT Left 08/28/2020    Procedure: IRRIGATION AND DEBRIDEMENT FOOT;  Surgeon: Samara Deist, DPM;  Location: [facility] ORS;  Service: Podiatry;  Laterality: Left;  . NO PAST SURGERIES    . WISDOM TOOTH EXTRACTION      Social History   Socioeconomic History  . Marital status: Single    Spouse name: Not on file  . Number of children: Not on file  . Years of education: Not on file  . Highest education level: Not on file  Occupational History  . Not on file  Tobacco Use  . Smoking status: Never Smoker  . Smokeless tobacco: Never Used  Vaping Use  . Vaping Use: Never used  Substance and Sexual Activity  . Alcohol use: No  . Drug use: No  . Sexual activity: Not on file  Other Topics Concern  . Not on file  Social History Narrative  . Not on file   Social Determinants of Health   Financial Resource Strain:   . Difficulty of Paying Living Expenses: Not on file  Food Insecurity:   . Worried About Charity fundraiser in the Last Year: Not on file  . Ran Out of Food in the Last Year: Not on file  Transportation Needs:   . Lack of Transportation (Medical): Not on file  . Lack of Transportation (Non-Medical): Not on file  Physical Activity:   . Days of Exercise per Week: Not on file  . Minutes of Exercise per Session:  Not on file  Stress:   . Feeling of Stress : Not on file  Social Connections:   . Frequency of Communication with Friends and Family: Not on file  . Frequency of Social Gatherings with Friends and Family: Not on file  . Attends Religious Services: Not on file  . Active Member of Clubs or Organizations: Not on file  . Attends Archivist Meetings: Not on file  . Marital Status: Not on file  Intimate Partner Violence:   . Fear of Current or Ex-Partner: Not on file  . Emotionally Abused: Not on file  . Physically Abused: Not on file  . Sexually Abused: Not on file    Family History  Problem Relation Age of Onset  . Heart attack Mother   . Diabetes Mellitus II Father   . Stroke  Father   . Heart attack Father   . Lung cancer Sister    Allergies  Allergen Reactions  . Erythromycin Other (See Comments)    Abdominal pain    Current Outpatient Medications  Medication Sig Dispense Refill  . acetaminophen (TYLENOL) 325 MG tablet Take 650 mg by mouth every 6 (six) hours as needed.    . cyclobenzaprine (FLEXERIL) 10 MG tablet Take by mouth.    . fluticasone (FLONASE) 50 MCG/ACT nasal spray Place into the nose.    Marland Kitchen lisinopril-hydrochlorothiazide (ZESTORETIC) 20-25 MG tablet Take 1 tablet by mouth daily.    . metFORMIN (GLUCOPHAGE) 500 MG tablet Take 2 tablets (1,000 mg total) by mouth 2 (two) times daily with a meal. 360 tablet 0  . naproxen sodium (ALEVE) 220 MG tablet Take 220 mg by mouth.    . Cholecalciferol 2000 units TABS Take 1 tablet by mouth daily.     No current facility-administered medications for this visit.     Abtx:  Anti-infectives (From admission, onward)   None      REVIEW OF SYSTEMS:  Const: negative fever, negative chills, negative weight loss Eyes: negative diplopia or visual changes, negative eye pain ENT: negative coryza, negative sore throat Resp: negative cough, hemoptysis, dyspnea Cards: negative for chest pain, palpitations, lower extremity edema GU: negative for frequency, dysuria and hematuria GI: Negative for abdominal pain, diarrhea, bleeding, constipation Skin: itching groin area Heme: negative for easy bruising and gum/nose bleeding MS: negative for myalgias, arthralgias, back pain and muscle weakness Neurolo:negative for headaches, dizziness, vertigo, memory problems  Psych: negative for feelings of anxiety, depression  Endocrine:  diabetes Allergy/Immunology- as above Objective:  VITALS:  BP 119/80   Pulse 84   Temp 98.2 F (36.8 C)   Resp 16   Ht 6' (1.829 m)   Wt 270 lb (122.5 kg)   SpO2 98%   BMI 36.62 kg/m  PHYSICAL EXAM:  General: Alert, cooperative, no distress, appears stated age.  Head:  Normocephalic, without obvious abnormality, atraumatic. Eyes: Conjunctivae clear, anicteric sclerae. Pupils are equal ENT Nares normal. No drainage or sinus tenderness. Lips, mucosa, and tongue normal. No Thrush Neck: Supple, symmetrical, no adenopathy, thyroid: non tender no carotid bruit and no JVD. Back: No CVA tenderness. Lungs: Clear to auscultation bilaterally. No Wheezing or Rhonchi. No rales. Heart: Regular rate and rhythm, no murmur, rub or gallop. Abdomen: Soft, non-tender,not distended. Bowel sounds normal. No masses Extremities: left foot  The wound healed completely  09/24/20   08/29/20    08/27/20      Skin: scaly patch inner thigh Lymph: Cervical, supraclavicular normal. Neurologic: Grossly non-focal Pertinent Labs Lab Results  CBC    Component Value Date/Time   WBC 9.3 08/29/2020 0351   RBC 4.26 08/29/2020 0351   HGB 12.5 (L) 08/29/2020 0351   HGB 13.8 06/08/2013 0618   HCT 36.9 (L) 08/29/2020 0351   HCT 42.6 06/08/2013 0618   PLT 279 08/29/2020 0351   PLT 261 06/08/2013 0618   MCV 86.6 08/29/2020 0351   MCV 87 06/08/2013 0618   MCH 29.3 08/29/2020 0351   MCHC 33.9 08/29/2020 0351   RDW 12.3 08/29/2020 0351   RDW 14.5 06/08/2013 0618   LYMPHSABS 1.4 08/27/2020 1015   MONOABS 0.9 08/27/2020 1015   EOSABS 0.1 08/27/2020 1015   BASOSABS 0.0 08/27/2020 1015    CMP Latest Ref Rng & Units 08/29/2020 08/28/2020 08/27/2020  Glucose 70 - 99 mg/dL 246(H) 158(H) 232(H)  BUN 6 - 20 mg/dL 23(H) 21(H) 23(H)  Creatinine 0.61 - 1.24 mg/dL 1.18 1.09 1.48(H)  Sodium 135 - 145 mmol/L 136 136 133(L)  Potassium 3.5 - 5.1 mmol/L 4.7 4.3 4.5  Chloride 98 - 111 mmol/L 104 104 99  CO2 22 - 32 mmol/L 22 23 21(L)  Calcium 8.9 - 10.3 mg/dL 8.9 8.9 9.6  Total Protein 6.5 - 8.1 g/dL - - 8.5(H)  Total Bilirubin 0.3 - 1.2 mg/dL - - 1.0  Alkaline Phos 38 - 126 U/L - - 49  AST 15 - 41 U/L - - 17  ALT 0 - 44 U/L - - 20     ? Impression/Recommendation ?left foot  infection s/p I/D of the MTP plantar abscess on 08/29/20-staph aureus in culture  Completed 20 days of oral augmentin Doing very well   DM on metformin  ?Tinea cruris/candida- for clotrimazole cream  ?Follow prn ___________________________________________________ Discussed with patient Note:  This document was prepared using Dragon voice recognition software and may include unintentional dictation errors.

## 2020-09-24 NOTE — Patient Instructions (Signed)
You are ehre for follow of the left foot infection which has healed completely You have jock itch- Clotrimazole cream has been prescribed and sent to your pharmacy. You have completed antibiotics and you dont need any more You can follow up with Dr.Fowler

## 2020-09-26 ENCOUNTER — Telehealth: Payer: Self-pay

## 2020-09-26 NOTE — Telephone Encounter (Signed)
Per pharmacy the Clotrimazole 1% Topical is only available as a cream and is OTC only due to insurance. Also called patient who agreed to get it OTC as Cream and directions remain the same.

## 2021-08-26 IMAGING — CR DG ORBITS FOR FOREIGN BODY
1 series · 2 of 2 positions shown · non-contrast
Comparison: None.

CLINICAL DATA: Metal working/exposure; clearance prior to MRI

EXAM:
ORBITS FOR FOREIGN BODY - 2 VIEW

[Series 1: dg eye foreign body · 0.14mm/px · 2 of 2 slices shown]
[im 1/2]
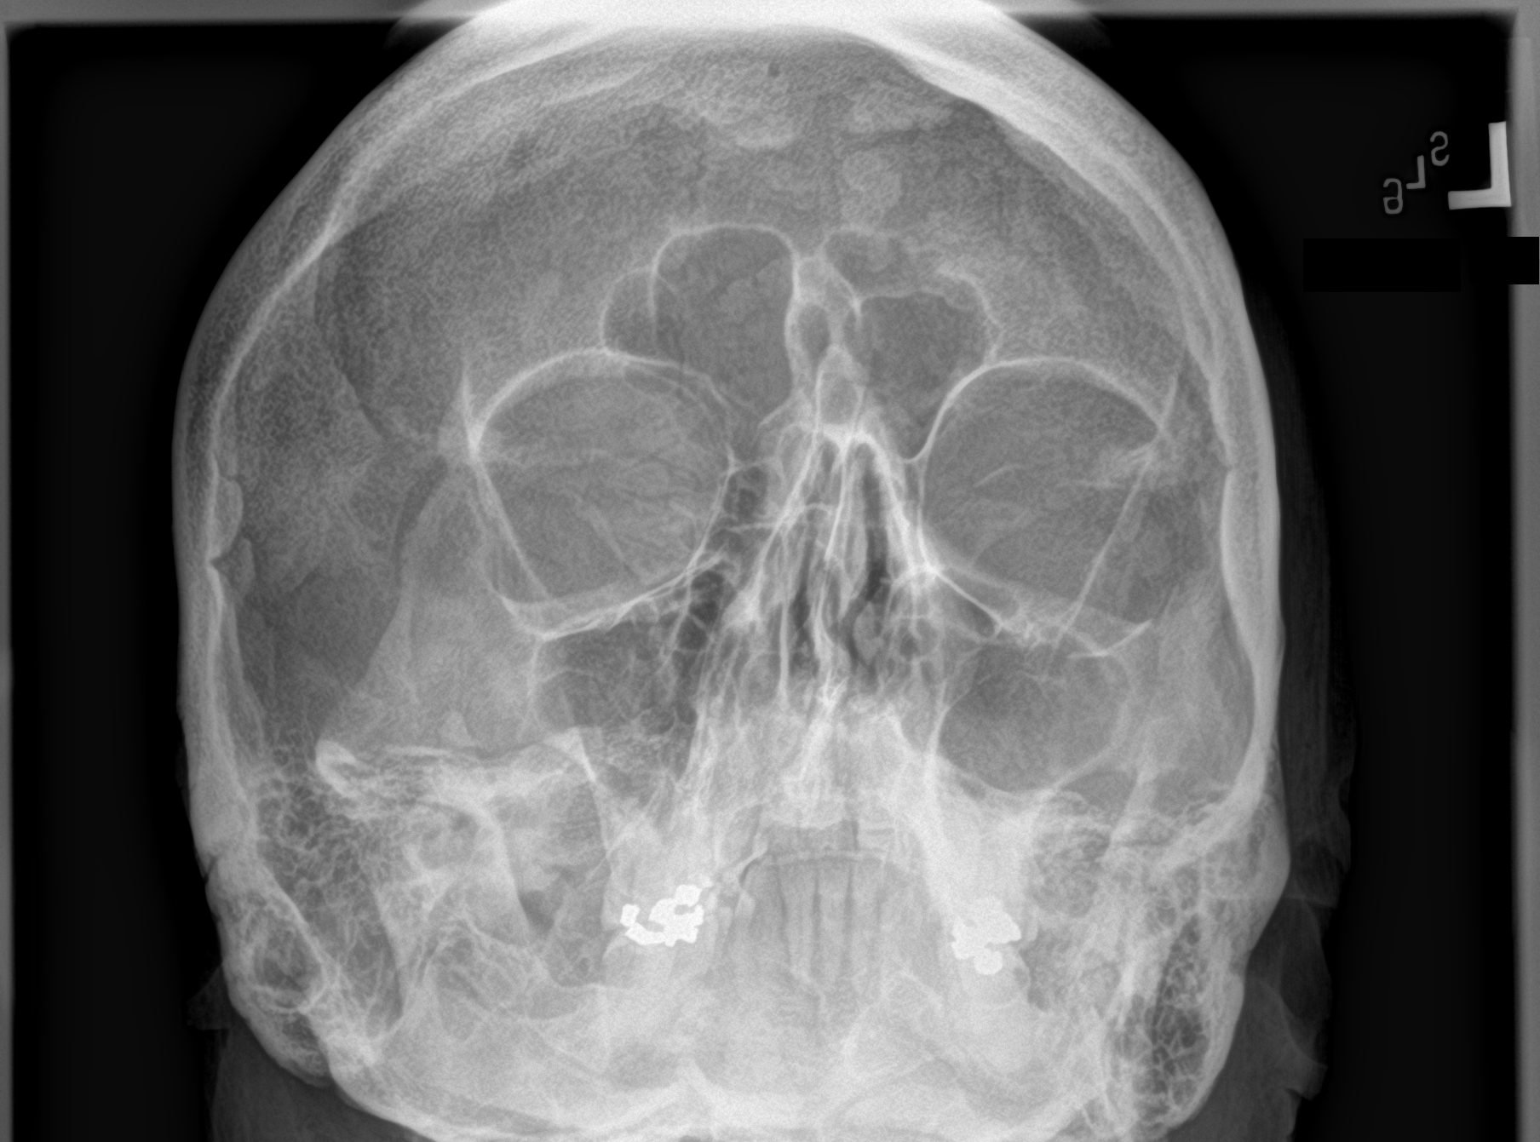
[im 2/2]
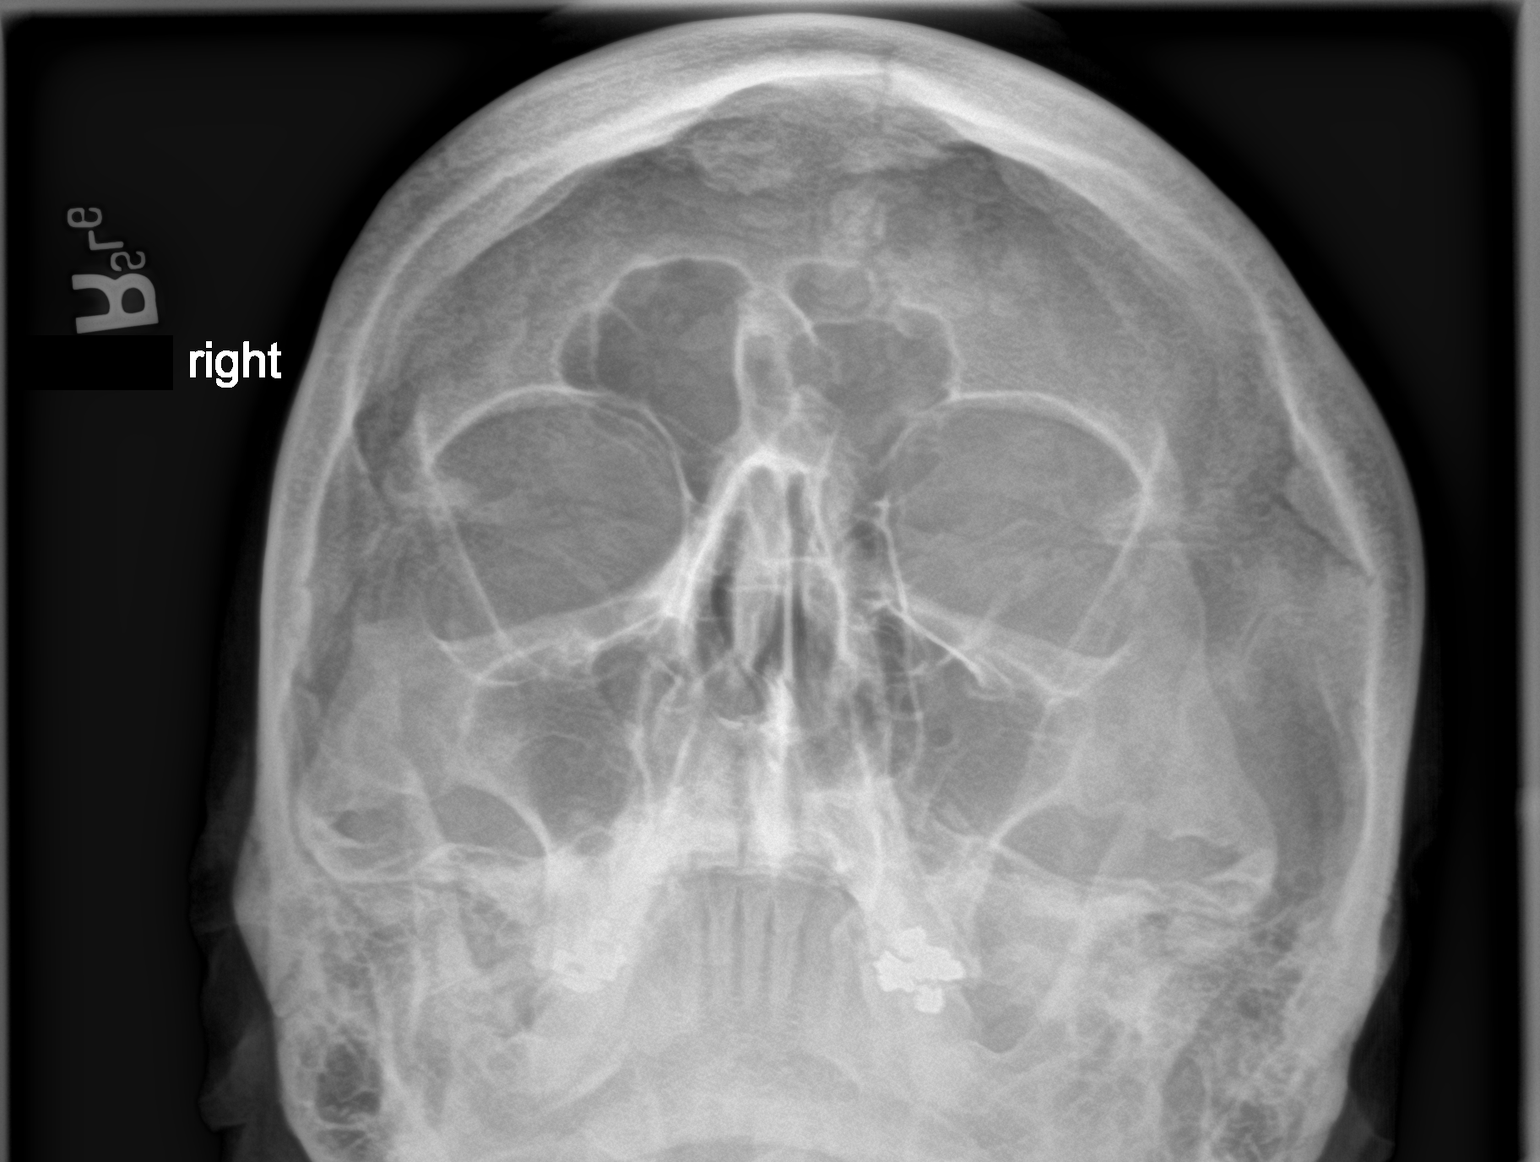

[2 of 2 positions shown; findings below may reference images not displayed]

FINDINGS: There is no evidence of metallic foreign body within the orbits. No
significant bone abnormality identified.
IMPRESSION: No evidence of metallic foreign body within the orbits.

## 2022-08-10 ENCOUNTER — Emergency Department: Payer: 59

## 2022-08-10 ENCOUNTER — Other Ambulatory Visit: Payer: Self-pay

## 2022-08-10 ENCOUNTER — Inpatient Hospital Stay
Admission: EM | Admit: 2022-08-10 | Discharge: 2022-08-13 | DRG: 854 | Disposition: A | Discharge status: Home / Self Care | Payer: 59 | Attending: Family Medicine | Admitting: Family Medicine

## 2022-08-10 DIAGNOSIS — N179 Acute kidney failure, unspecified: Secondary | ICD-10-CM | POA: Diagnosis present

## 2022-08-10 DIAGNOSIS — D61818 Other pancytopenia: Secondary | ICD-10-CM | POA: Diagnosis not present

## 2022-08-10 DIAGNOSIS — B999 Unspecified infectious disease: Principal | ICD-10-CM

## 2022-08-10 DIAGNOSIS — M71172 Other infective bursitis, left ankle and foot: Secondary | ICD-10-CM | POA: Diagnosis not present

## 2022-08-10 DIAGNOSIS — Z87442 Personal history of urinary calculi: Secondary | ICD-10-CM

## 2022-08-10 DIAGNOSIS — E1140 Type 2 diabetes mellitus with diabetic neuropathy, unspecified: Secondary | ICD-10-CM | POA: Diagnosis not present

## 2022-08-10 DIAGNOSIS — I739 Peripheral vascular disease, unspecified: Secondary | ICD-10-CM | POA: Diagnosis not present

## 2022-08-10 DIAGNOSIS — I10 Essential (primary) hypertension: Secondary | ICD-10-CM | POA: Diagnosis present

## 2022-08-10 DIAGNOSIS — L089 Local infection of the skin and subcutaneous tissue, unspecified: Secondary | ICD-10-CM | POA: Diagnosis not present

## 2022-08-10 DIAGNOSIS — E669 Obesity, unspecified: Secondary | ICD-10-CM | POA: Diagnosis not present

## 2022-08-10 DIAGNOSIS — E119 Type 2 diabetes mellitus without complications: Secondary | ICD-10-CM | POA: Diagnosis not present

## 2022-08-10 DIAGNOSIS — Z6836 Body mass index (BMI) 36.0-36.9, adult: Secondary | ICD-10-CM | POA: Diagnosis not present

## 2022-08-10 DIAGNOSIS — M79675 Pain in left toe(s): Secondary | ICD-10-CM | POA: Diagnosis not present

## 2022-08-10 DIAGNOSIS — E11621 Type 2 diabetes mellitus with foot ulcer: Secondary | ICD-10-CM | POA: Diagnosis present

## 2022-08-10 DIAGNOSIS — Z881 Allergy status to other antibiotic agents status: Secondary | ICD-10-CM

## 2022-08-10 DIAGNOSIS — Z7984 Long term (current) use of oral hypoglycemic drugs: Secondary | ICD-10-CM

## 2022-08-10 DIAGNOSIS — E872 Acidosis, unspecified: Secondary | ICD-10-CM | POA: Diagnosis present

## 2022-08-10 DIAGNOSIS — Z79899 Other long term (current) drug therapy: Secondary | ICD-10-CM

## 2022-08-10 DIAGNOSIS — R6520 Severe sepsis without septic shock: Secondary | ICD-10-CM | POA: Diagnosis not present

## 2022-08-10 DIAGNOSIS — E11628 Type 2 diabetes mellitus with other skin complications: Secondary | ICD-10-CM

## 2022-08-10 DIAGNOSIS — A419 Sepsis, unspecified organism: Principal | ICD-10-CM | POA: Diagnosis present

## 2022-08-10 DIAGNOSIS — N170 Acute kidney failure with tubular necrosis: Secondary | ICD-10-CM | POA: Diagnosis not present

## 2022-08-10 DIAGNOSIS — L97529 Non-pressure chronic ulcer of other part of left foot with unspecified severity: Secondary | ICD-10-CM | POA: Diagnosis not present

## 2022-08-10 DIAGNOSIS — M7989 Other specified soft tissue disorders: Secondary | ICD-10-CM | POA: Diagnosis not present

## 2022-08-10 DIAGNOSIS — Z833 Family history of diabetes mellitus: Secondary | ICD-10-CM | POA: Diagnosis not present

## 2022-08-10 DIAGNOSIS — L02612 Cutaneous abscess of left foot: Secondary | ICD-10-CM | POA: Diagnosis not present

## 2022-08-10 DIAGNOSIS — M71572 Other bursitis, not elsewhere classified, left ankle and foot: Secondary | ICD-10-CM | POA: Diagnosis not present

## 2022-08-10 DIAGNOSIS — L97521 Non-pressure chronic ulcer of other part of left foot limited to breakdown of skin: Secondary | ICD-10-CM | POA: Diagnosis not present

## 2022-08-10 DIAGNOSIS — L03116 Cellulitis of left lower limb: Secondary | ICD-10-CM | POA: Diagnosis not present

## 2022-08-10 DIAGNOSIS — Z8249 Family history of ischemic heart disease and other diseases of the circulatory system: Secondary | ICD-10-CM | POA: Diagnosis not present

## 2022-08-10 DIAGNOSIS — Z872 Personal history of diseases of the skin and subcutaneous tissue: Secondary | ICD-10-CM | POA: Diagnosis not present

## 2022-08-10 DIAGNOSIS — R600 Localized edema: Secondary | ICD-10-CM | POA: Diagnosis not present

## 2022-08-10 LAB — CBC WITH DIFFERENTIAL/PLATELET
Abs Immature Granulocytes: 0.02 10*3/uL (ref 0.00–0.07)
Basophils Absolute: 0 10*3/uL (ref 0.0–0.1)
Basophils Relative: 0 %
Eosinophils Absolute: 0 10*3/uL (ref 0.0–0.5)
Eosinophils Relative: 1 %
HCT: 43.1 % (ref 39.0–52.0)
Hemoglobin: 14.1 g/dL (ref 13.0–17.0)
Immature Granulocytes: 0 %
Lymphocytes Relative: 20 %
Lymphs Abs: 1.3 10*3/uL (ref 0.7–4.0)
MCH: 28.2 pg (ref 26.0–34.0)
MCHC: 32.7 g/dL (ref 30.0–36.0)
MCV: 86.2 fL (ref 80.0–100.0)
Monocytes Absolute: 0.7 10*3/uL (ref 0.1–1.0)
Monocytes Relative: 11 %
Neutro Abs: 4.3 10*3/uL (ref 1.7–7.7)
Neutrophils Relative %: 68 %
Platelets: 200 10*3/uL (ref 150–400)
RBC: 5 MIL/uL (ref 4.22–5.81)
RDW: 13.2 % (ref 11.5–15.5)
WBC: 6.4 10*3/uL (ref 4.0–10.5)
nRBC: 0 % (ref 0.0–0.2)

## 2022-08-10 LAB — COMPREHENSIVE METABOLIC PANEL
ALT: 34 U/L (ref 0–44)
AST: 44 U/L — ABNORMAL HIGH (ref 15–41)
Albumin: 4.4 g/dL (ref 3.5–5.0)
Alkaline Phosphatase: 48 U/L (ref 38–126)
Anion gap: 15 (ref 5–15)
BUN: 43 mg/dL — ABNORMAL HIGH (ref 6–20)
CO2: 22 mmol/L (ref 22–32)
Calcium: 9.5 mg/dL (ref 8.9–10.3)
Chloride: 97 mmol/L — ABNORMAL LOW (ref 98–111)
Creatinine, Ser: 2.1 mg/dL — ABNORMAL HIGH (ref 0.61–1.24)
GFR, Estimated: 36 mL/min — ABNORMAL LOW (ref >60)
Glucose, Bld: 136 mg/dL — ABNORMAL HIGH (ref 70–99)
Potassium: 5.1 mmol/L (ref 3.5–5.1)
Sodium: 134 mmol/L — ABNORMAL LOW (ref 135–145)
Total Bilirubin: 0.7 mg/dL (ref 0.3–1.2)
Total Protein: 8.3 g/dL — ABNORMAL HIGH (ref 6.5–8.1)

## 2022-08-10 LAB — LACTIC ACID, PLASMA: Lactic Acid, Venous: 3.6 mmol/L (ref 0.5–1.9)

## 2022-08-10 MED ORDER — VANCOMYCIN HCL IN DEXTROSE 1-5 GM/200ML-% IV SOLN
1000.0000 mg | Freq: Once | INTRAVENOUS | Status: AC
  Administered 2022-08-11: 1000 mg via INTRAVENOUS
  Filled 2022-08-10: qty 200

## 2022-08-10 MED ORDER — SODIUM CHLORIDE 0.9 % IV SOLN
2.0000 g | Freq: Once | INTRAVENOUS | Status: AC
  Administered 2022-08-10: 2 g via INTRAVENOUS
  Filled 2022-08-10: qty 12.5

## 2022-08-10 MED ORDER — VANCOMYCIN HCL 1500 MG/300ML IV SOLN
1500.0000 mg | Freq: Once | INTRAVENOUS | Status: AC
  Administered 2022-08-11: 1500 mg via INTRAVENOUS
  Filled 2022-08-10: qty 300

## 2022-08-10 MED ORDER — SODIUM CHLORIDE 0.9 % IV BOLUS
1000.0000 mL | Freq: Once | INTRAVENOUS | Status: AC
  Administered 2022-08-10: 1000 mL via INTRAVENOUS

## 2022-08-10 NOTE — ED Provider Triage Note (Signed)
Emergency Medicine Provider Triage Evaluation Note  Chris Kline , a 56 y.o. male  was evaluated in triage.  Pt complains of ulceration to the bottom of the left foot.  Patient has history of same in the past.  Surgery has had to intervene 2 years ago.  Patient noted a "blister" 2 weeks ago is now draining purulence with increased pain.  No fevers or chills..  Review of Systems  Positive: Foot pain/sore Negative: Fevers, chills, edema of the lower extremity  Physical Exam  BP 120/84   Pulse 95   Temp 99.4 F (37.4 C) (Oral)   Resp 14   Ht 6' (1.829 m)   Wt 122.5 kg   SpO2 98%   BMI 36.62 kg/m  Gen:   Awake, no distress   Resp:  Normal effort  MSK:   Moves extremities without difficulty.  Ulceration of the Kline of the foot with no active purulence.  Purulent drainage noted on the soft however. Other:    Medical Decision Making  Medically screening exam initiated at 8:07 PM.  Appropriate orders placed.  Chris Kline was informed that the remainder of the evaluation will be completed by another provider, this initial triage assessment does not replace that evaluation, and the importance of remaining in the ED until their evaluation is complete.  Patient with foot ulcer and diabetes.  History of previous surgical management 2 years ago.  Patient had labs, imaging at this time.   Chris Kline 08/10/22 2008

## 2022-08-10 NOTE — ED Notes (Signed)
Patient provided with sandwich box.  Made aware that he will be NPO after midnight

## 2022-08-10 NOTE — ED Notes (Signed)
Patient taken to MRI at this time 

## 2022-08-10 NOTE — Consult Note (Signed)
PHARMACY -  BRIEF ANTIBIOTIC NOTE   Pharmacy has received consult(s) for cefepime and Vancomycin from an ED provider.  The patient's profile has been reviewed for ht/wt/allergies/indication/available labs.    One time order(s) placed for  Cefepime 2 gram Vancomycin 1000 x 1 followed by 1500 mg x 1 for total LD of 2500 mg  Further antibiotics/pharmacy consults should be ordered by admitting physician if indicated.                       Thank you, Dorothe Pea, PharmD, BCPS Clinical Pharmacist   08/10/2022  9:10 PM

## 2022-08-10 NOTE — ED Notes (Signed)
Lactic results received; acuity level changed to 2; will take pt to next available exam room

## 2022-08-10 NOTE — ED Provider Notes (Signed)
[facility] Provider Note    Event Date/Time   First MD Initiated Contact with Patient 08/10/22 2102     (approximate)   History   Foot Pain   HPI  Chris Kline is a 56 y.o. male with a history of diabetes and previous foot infection.  Patient reports that for about 2 weeks has had tenderness in the bottom of his left foot below the great toe.  He started to notice drainage this evening after walking on it and the area has become progressively swollen over the last week.  Pus has been draining from it.  He advised that he thinks that he likely needs to see podiatry, he had a similar issue about 2 years ago and was admitted to the hospital and had to receive IV antibiotics for about the same thing  He follows with Dr. Vickki Muff  Denies pain in the foot.  No cold or blue foot.  He has been having some fevers at home.     Physical Exam   Triage Vital Signs: ED Triage Vitals  Enc Vitals Group     BP 08/10/22 2005 120/84     Pulse Rate 08/10/22 2005 95     Resp 08/10/22 2005 14     Temp 08/10/22 2005 99.4 F (37.4 C)     Temp Source 08/10/22 2005 Oral     SpO2 08/10/22 2005 98 %     Weight 08/10/22 2006 270 lb (122.5 kg)     Height 08/10/22 2006 6' (1.829 m)     Head Circumference --      Peak Flow --      Pain Score 08/10/22 2005 2     Pain Loc --      Pain Edu? --      Excl. in [place]? --     Most recent vital signs: Vitals:   08/10/22 2005 08/10/22 2155  BP: 120/84 127/76  Pulse: 95 89  Resp: 14 16  Temp: 99.4 F (37.4 C)   SpO2: 98% 98%     General: Awake, no distress.  Very pleasant.  Seated on the side of the bed moves without difficulty. CV:  Good peripheral perfusion.  Left foot strong palpable dorsalis pedis and posterior tibial pulses.  Normal perfusion to all toes of the left foot. Resp:  Normal effort.  Abd:  No distention.  Other:  Left lower extremity no obvious erythema except on the bottom of the left foot on the dorsal  surface underlying the first metatarsal phalangeal joint there is an area of induration and fluctuance with slight purulent drainage emerging.  There is no gas formation or crepitance in the leg.  This does not seem to extend up the foot or past the area of the dorsum of the foot at this time.   ED Results / Procedures / Treatments   Labs (all labs ordered are listed, but only abnormal results are displayed) Labs Reviewed  COMPREHENSIVE METABOLIC PANEL - Abnormal; Notable for the following components:      Result Value   Sodium 134 (*)    Chloride 97 (*)    Glucose, Bld 136 (*)    BUN 43 (*)    Creatinine, Ser 2.10 (*)    Total Protein 8.3 (*)    AST 44 (*)    GFR, Estimated 36 (*)    All other components within normal limits  LACTIC ACID, PLASMA - Abnormal; Notable for the following components:  Lactic Acid, Venous 3.6 (*)    All other components within normal limits  CULTURE, BLOOD (SINGLE)  CULTURE, BLOOD (ROUTINE X 2)  CULTURE, BLOOD (ROUTINE X 2)  CBC WITH DIFFERENTIAL/PLATELET  LACTIC ACID, PLASMA     EKG     RADIOLOGY  Personally interpreted imaging of the left foot, notable for swelling about the dorsum of the foot.  DG Foot Complete Left  Result Date: 08/10/2022 CLINICAL DATA:  Foot ulceration/infection. EXAM: LEFT FOOT - COMPLETE 3+ VIEW COMPARISON:  None Available. FINDINGS: There is no evidence of fracture or dislocation. There is no cortical erosion or periosteal reaction to suggest osteomyelitis. Achilles enthesopathy. Soft tissue swelling about dorsum of the foot about the first metatarsophalangeal joint. IMPRESSION: No cortical erosion or periosteal reaction to suggest osteomyelitis. Soft tissue swelling about dorsal aspect of the first metatarsophalangeal joint, MRI examination could be considered for further evaluation if clinically warranted. Electronically Signed   By: Keane Police D.O.   On: 08/10/2022 20:39    MRI of the left foot pending at time of  admission.  Dr. Damita Dunnings aware.  MRI ordered without contrast as directed by podiatry Dr. Vickki Muff  PROCEDURES:  Critical Care performed: No  Procedures   MEDICATIONS ORDERED IN ED: Medications  vancomycin (VANCOCIN) IVPB 1000 mg/200 mL premix (has no administration in time range)    Followed by  vancomycin (VANCOREADY) IVPB 1500 mg/300 mL (has no administration in time range)  sodium chloride 0.9 % bolus 1,000 mL (1,000 mLs Intravenous New Bag/Given 08/10/22 2155)  ceFEPIme (MAXIPIME) 2 g in sodium chloride 0.9 % 100 mL IVPB (0 g Intravenous Stopped 08/10/22 2249)     IMPRESSION / MDM / ASSESSMENT AND PLAN / ED COURSE  I reviewed the triage vital signs and the nursing notes.                              Differential diagnosis includes, but is not limited to, diabetic foot wound, osteomyelitis, abscess, retained foreign body etc.  Patient's presentation is notable also for AKI.  He does not appear particularly volume deplete, but also is noted to have elevated lactic acid.  Findings are in keeping with a concern for potential sepsis, blood culture sent.  Does not technically meet sepsis criteria at this time though as he has normal white count, no significant elevation of his fever, normal respiratory pattern and normal white count but obviously has an infected left foot and is high risk for complication due to his diabetes  AKI, elevated lactic acid.  Fluid bolus administered  Patient's presentation is most consistent with acute complicated illness / injury requiring diagnostic workup.  ----------------------------------------- 11:30 PM on 08/10/2022 ----------------------------------------- Patient admitted to hospitalist service under the care of Dr. Damita Dunnings, who is also aware of pending podiatry consult and request for n.p.o. at midnight  Patient is understanding agreeable with plan for admission  FINAL CLINICAL IMPRESSION(S) / ED DIAGNOSES   Final diagnoses:  Infection   Diabetic infection of left foot ([place])     Rx / DC Orders   ED Discharge Orders     None        Note:  This document was prepared using Dragon voice recognition software and may include unintentional dictation errors.   Delman Kitten, MD 08/10/22 [phone]

## 2022-08-10 NOTE — ED Triage Notes (Signed)
Pt presents via POV c/o sore to left foot x3 weeks. Reports started as a blister but Sunday noticed purulence from sore. PMH DM.

## 2022-08-11 ENCOUNTER — Inpatient Hospital Stay: Payer: 59

## 2022-08-11 ENCOUNTER — Encounter: Payer: Self-pay | Admitting: Internal Medicine

## 2022-08-11 ENCOUNTER — Encounter
Admission: EM | Disposition: A | Discharge status: Home / Self Care | Payer: Self-pay | Source: Home / Self Care | Attending: Family Medicine

## 2022-08-11 ENCOUNTER — Inpatient Hospital Stay: Payer: 59 | Admitting: Anesthesiology

## 2022-08-11 ENCOUNTER — Other Ambulatory Visit: Payer: Self-pay

## 2022-08-11 DIAGNOSIS — A419 Sepsis, unspecified organism: Secondary | ICD-10-CM | POA: Diagnosis not present

## 2022-08-11 DIAGNOSIS — N170 Acute kidney failure with tubular necrosis: Secondary | ICD-10-CM | POA: Diagnosis not present

## 2022-08-11 DIAGNOSIS — N179 Acute kidney failure, unspecified: Secondary | ICD-10-CM

## 2022-08-11 DIAGNOSIS — R6520 Severe sepsis without septic shock: Secondary | ICD-10-CM

## 2022-08-11 HISTORY — PX: INCISION AND DRAINAGE: SHX5863

## 2022-08-11 LAB — CK: Total CK: 375 U/L (ref 49–397)

## 2022-08-11 LAB — CBG MONITORING, ED
Glucose-Capillary: 117 mg/dL — ABNORMAL HIGH (ref 70–99)
Glucose-Capillary: 119 mg/dL — ABNORMAL HIGH (ref 70–99)
Glucose-Capillary: 125 mg/dL — ABNORMAL HIGH (ref 70–99)
Glucose-Capillary: 113 mg/dL — ABNORMAL HIGH (ref 70–99)
Glucose-Capillary: 132 mg/dL — ABNORMAL HIGH (ref 70–99)

## 2022-08-11 LAB — PROTIME-INR
INR: 1 (ref 0.8–1.2)
Prothrombin Time: 13.4 seconds (ref 11.4–15.2)

## 2022-08-11 LAB — SURGICAL PCR SCREEN
MRSA, PCR: NEGATIVE
Staphylococcus aureus: POSITIVE — AB

## 2022-08-11 LAB — RENAL FUNCTION PANEL
Albumin: 3.9 g/dL (ref 3.5–5.0)
Anion gap: 12 (ref 5–15)
BUN: 34 mg/dL — ABNORMAL HIGH (ref 6–20)
CO2: 19 mmol/L — ABNORMAL LOW (ref 22–32)
Calcium: 8.7 mg/dL — ABNORMAL LOW (ref 8.9–10.3)
Chloride: 103 mmol/L (ref 98–111)
Creatinine, Ser: 1.56 mg/dL — ABNORMAL HIGH (ref 0.61–1.24)
GFR, Estimated: 52 mL/min — ABNORMAL LOW (ref >60)
Glucose, Bld: 120 mg/dL — ABNORMAL HIGH (ref 70–99)
Phosphorus: 3.2 mg/dL (ref 2.5–4.6)
Potassium: 5 mmol/L (ref 3.5–5.1)
Sodium: 134 mmol/L — ABNORMAL LOW (ref 135–145)

## 2022-08-11 LAB — C-REACTIVE PROTEIN: CRP: 8.3 mg/dL — ABNORMAL HIGH (ref <1.0)

## 2022-08-11 LAB — CORTISOL-AM, BLOOD: Cortisol - AM: 22.6 ug/dL (ref 6.7–22.6)

## 2022-08-11 LAB — HEMOGLOBIN A1C
Hgb A1c MFr Bld: 7.1 % — ABNORMAL HIGH (ref 4.8–5.6)
Mean Plasma Glucose: 157.07 mg/dL

## 2022-08-11 LAB — LACTIC ACID, PLASMA: Lactic Acid, Venous: 2.5 mmol/L (ref 0.5–1.9)

## 2022-08-11 LAB — GLUCOSE, CAPILLARY: Glucose-Capillary: 256 mg/dL — ABNORMAL HIGH (ref 70–99)

## 2022-08-11 LAB — SEDIMENTATION RATE: Sed Rate: 49 mm/hr — ABNORMAL HIGH (ref 0–20)

## 2022-08-11 LAB — PROCALCITONIN: Procalcitonin: 0.13 ng/mL

## 2022-08-11 LAB — HIV ANTIBODY (ROUTINE TESTING W REFLEX): HIV Screen 4th Generation wRfx: NONREACTIVE

## 2022-08-11 SURGERY — INCISION AND DRAINAGE
Anesthesia: General | Site: Foot | Laterality: Left

## 2022-08-11 MED ORDER — PROPOFOL 10 MG/ML IV BOLUS
INTRAVENOUS | Status: DC | PRN
  Administered 2022-08-11: 50 mg via INTRAVENOUS
  Administered 2022-08-11: 150 mg via INTRAVENOUS

## 2022-08-11 MED ORDER — INSULIN ASPART 100 UNIT/ML IJ SOLN
0.0000 [IU] | Freq: Every day | INTRAMUSCULAR | Status: DC
  Administered 2022-08-11: 3 [IU] via SUBCUTANEOUS
  Administered 2022-08-12: 2 [IU] via SUBCUTANEOUS
  Filled 2022-08-11 (×2): qty 1

## 2022-08-11 MED ORDER — LACTATED RINGERS IV BOLUS (SEPSIS)
1000.0000 mL | Freq: Once | INTRAVENOUS | Status: AC
  Administered 2022-08-11: 1000 mL via INTRAVENOUS

## 2022-08-11 MED ORDER — LIDOCAINE HCL (CARDIAC) PF 100 MG/5ML IV SOSY
PREFILLED_SYRINGE | INTRAVENOUS | Status: DC | PRN
  Administered 2022-08-11: 100 mg via INTRAVENOUS

## 2022-08-11 MED ORDER — LIDOCAINE HCL (PF) 1 % IJ SOLN
INTRAMUSCULAR | Status: AC
  Filled 2022-08-11: qty 30

## 2022-08-11 MED ORDER — VANCOMYCIN HCL 1250 MG/250ML IV SOLN: 1250.0000 mg | INTRAVENOUS | Status: DC

## 2022-08-11 MED ORDER — HYDROMORPHONE HCL 1 MG/ML IJ SOLN: 0.2500 mg | INTRAMUSCULAR | Status: DC | PRN

## 2022-08-11 MED ORDER — LIDOCAINE HCL 1 % IJ SOLN: INTRAMUSCULAR | Status: DC | PRN

## 2022-08-11 MED ORDER — CHLORHEXIDINE GLUCONATE 4 % EX LIQD
60.0000 mL | Freq: Once | CUTANEOUS | Status: DC
  Administered 2022-08-11: 4 via TOPICAL

## 2022-08-11 MED ORDER — OXYCODONE HCL 5 MG PO TABS: 5.0000 mg | ORAL_TABLET | Freq: Once | ORAL | Status: DC | PRN

## 2022-08-11 MED ORDER — ENOXAPARIN SODIUM 60 MG/0.6ML IJ SOSY
0.5000 mg/kg | PREFILLED_SYRINGE | INTRAMUSCULAR | Status: DC
  Administered 2022-08-11 – 2022-08-13 (×3): 62.5 mg via SUBCUTANEOUS
  Filled 2022-08-11 (×3): qty 1.2

## 2022-08-11 MED ORDER — LIDOCAINE-EPINEPHRINE 1 %-1:100000 IJ SOLN
INTRAMUSCULAR | Status: DC | PRN
  Administered 2022-08-11: 4 mL

## 2022-08-11 MED ORDER — ONDANSETRON HCL 4 MG PO TABS: 4.0000 mg | ORAL_TABLET | Freq: Four times a day (QID) | ORAL | Status: DC | PRN

## 2022-08-11 MED ORDER — ACETAMINOPHEN 650 MG RE SUPP: 650.0000 mg | Freq: Four times a day (QID) | RECTAL | Status: DC | PRN

## 2022-08-11 MED ORDER — ACETAMINOPHEN 10 MG/ML IV SOLN
INTRAVENOUS | Status: DC | PRN
  Administered 2022-08-11: 1000 mg via INTRAVENOUS

## 2022-08-11 MED ORDER — INSULIN ASPART 100 UNIT/ML IJ SOLN
0.0000 [IU] | Freq: Three times a day (TID) | INTRAMUSCULAR | Status: DC
  Administered 2022-08-12 (×2): 3 [IU] via SUBCUTANEOUS
  Administered 2022-08-12: 2 [IU] via SUBCUTANEOUS
  Filled 2022-08-11 (×3): qty 1

## 2022-08-11 MED ORDER — 0.9 % SODIUM CHLORIDE (POUR BTL) OPTIME
TOPICAL | Status: DC | PRN
  Administered 2022-08-11: 500 mL

## 2022-08-11 MED ORDER — VANCOMYCIN HCL 1750 MG/350ML IV SOLN
1750.0000 mg | INTRAVENOUS | Status: DC
  Administered 2022-08-11: 1750 mg via INTRAVENOUS
  Filled 2022-08-11: qty 350

## 2022-08-11 MED ORDER — PHENYLEPHRINE HCL (PRESSORS) 10 MG/ML IV SOLN
INTRAVENOUS | Status: DC | PRN
  Administered 2022-08-11 (×3): 80 ug via INTRAVENOUS

## 2022-08-11 MED ORDER — FENTANYL CITRATE (PF) 100 MCG/2ML IJ SOLN
INTRAMUSCULAR | Status: AC
  Filled 2022-08-11: qty 2

## 2022-08-11 MED ORDER — POVIDONE-IODINE 10 % EX SWAB
2.0000 | Freq: Once | CUTANEOUS | Status: AC
  Administered 2022-08-11: 2 via TOPICAL

## 2022-08-11 MED ORDER — MUPIROCIN 2 % EX OINT
1.0000 | TOPICAL_OINTMENT | Freq: Two times a day (BID) | CUTANEOUS | Status: DC
  Administered 2022-08-13: 1 via NASAL
  Filled 2022-08-11: qty 22

## 2022-08-11 MED ORDER — LIDOCAINE-EPINEPHRINE (PF) 1 %-1:200000 IJ SOLN
INTRAMUSCULAR | Status: AC
  Filled 2022-08-11: qty 30

## 2022-08-11 MED ORDER — MIDAZOLAM HCL 2 MG/2ML IJ SOLN
INTRAMUSCULAR | Status: AC
  Filled 2022-08-11: qty 2

## 2022-08-11 MED ORDER — FENTANYL CITRATE (PF) 100 MCG/2ML IJ SOLN
INTRAMUSCULAR | Status: DC | PRN
  Administered 2022-08-11: 100 ug via INTRAVENOUS

## 2022-08-11 MED ORDER — METHYLENE BLUE 1 % INJ SOLN
INTRAVENOUS | Status: AC
  Filled 2022-08-11: qty 10

## 2022-08-11 MED ORDER — BUPIVACAINE HCL (PF) 0.5 % IJ SOLN
INTRAMUSCULAR | Status: AC
  Filled 2022-08-11: qty 30

## 2022-08-11 MED ORDER — MIDAZOLAM HCL 2 MG/2ML IJ SOLN
INTRAMUSCULAR | Status: DC | PRN
  Administered 2022-08-11: 2 mg via INTRAVENOUS

## 2022-08-11 MED ORDER — HYDROCODONE-ACETAMINOPHEN 5-325 MG PO TABS: 1.0000 | ORAL_TABLET | ORAL | Status: DC | PRN

## 2022-08-11 MED ORDER — ACETAMINOPHEN 10 MG/ML IV SOLN
INTRAVENOUS | Status: AC
  Filled 2022-08-11: qty 100

## 2022-08-11 MED ORDER — BUPIVACAINE HCL 0.5 % IJ SOLN
INTRAMUSCULAR | Status: DC | PRN
  Administered 2022-08-11: 4 mL

## 2022-08-11 MED ORDER — ONDANSETRON HCL 4 MG/2ML IJ SOLN
INTRAMUSCULAR | Status: DC | PRN
  Administered 2022-08-11: 4 mg via INTRAVENOUS

## 2022-08-11 MED ORDER — PROPOFOL 10 MG/ML IV BOLUS
INTRAVENOUS | Status: AC
  Filled 2022-08-11: qty 20

## 2022-08-11 MED ORDER — BUPIVACAINE HCL (PF) 0.25 % IJ SOLN
INTRAMUSCULAR | Status: AC
  Filled 2022-08-11: qty 30

## 2022-08-11 MED ORDER — ACETAMINOPHEN 325 MG PO TABS
650.0000 mg | ORAL_TABLET | Freq: Four times a day (QID) | ORAL | Status: DC | PRN
  Administered 2022-08-12: 650 mg via ORAL
  Filled 2022-08-11: qty 2

## 2022-08-11 MED ORDER — ONDANSETRON HCL 4 MG/2ML IJ SOLN: 4.0000 mg | Freq: Four times a day (QID) | INTRAMUSCULAR | Status: DC | PRN

## 2022-08-11 MED ORDER — ENOXAPARIN SODIUM 40 MG/0.4ML IJ SOSY: 40.0000 mg | PREFILLED_SYRINGE | INTRAMUSCULAR | Status: DC

## 2022-08-11 MED ORDER — DEXAMETHASONE SODIUM PHOSPHATE 10 MG/ML IJ SOLN
INTRAMUSCULAR | Status: DC | PRN
  Administered 2022-08-11: 5 mg via INTRAVENOUS

## 2022-08-11 MED ORDER — OXYCODONE HCL 5 MG/5ML PO SOLN: 5.0000 mg | Freq: Once | ORAL | Status: DC | PRN

## 2022-08-11 MED ORDER — SODIUM CHLORIDE 0.9 % IV SOLN
2.0000 g | INTRAVENOUS | Status: DC
  Administered 2022-08-11 – 2022-08-13 (×3): 2 g via INTRAVENOUS
  Filled 2022-08-11 (×3): qty 20

## 2022-08-11 MED ORDER — LACTATED RINGERS IV SOLN: INTRAVENOUS | Status: DC

## 2022-08-11 SURGICAL SUPPLY — 62 items
BLADE OSC/SAGITTAL MD 5.5X18 (BLADE) IMPLANT
BLADE OSCILLATING/SAGITTAL (BLADE)
BLADE SURG 15 STRL LF DISP TIS (BLADE) ×1 IMPLANT
BLADE SURG 15 STRL SS (BLADE) ×1
BLADE SW THK.38XMED LNG THN (BLADE) IMPLANT
BNDG COHESIVE 4X5 TAN STRL LF (GAUZE/BANDAGES/DRESSINGS) ×1 IMPLANT
BNDG COHESIVE 6X5 TAN ST LF (GAUZE/BANDAGES/DRESSINGS) ×1 IMPLANT
BNDG ELASTIC 4X5.8 VLCR STR LF (GAUZE/BANDAGES/DRESSINGS) ×1 IMPLANT
BNDG ESMARK 4X12 TAN STRL LF (GAUZE/BANDAGES/DRESSINGS) ×1 IMPLANT
BNDG GAUZE DERMACEA FLUFF 4 (GAUZE/BANDAGES/DRESSINGS) ×1 IMPLANT
BNDG STRETCH GAUZE 3IN X12FT (GAUZE/BANDAGES/DRESSINGS) ×1 IMPLANT
CANISTER WOUND CARE 500ML ATS (WOUND CARE) ×1 IMPLANT
CUFF TOURN SGL QUICK 12 (TOURNIQUET CUFF) IMPLANT
CUFF TOURN SGL QUICK 18X4 (TOURNIQUET CUFF) IMPLANT
DRAPE FLUOR MINI C-ARM 54X84 (DRAPES) IMPLANT
DRAPE XRAY CASSETTE 23X24 (DRAPES) IMPLANT
DRSG MEPILEX FLEX 3X3 (GAUZE/BANDAGES/DRESSINGS) IMPLANT
DURAPREP 26ML APPLICATOR (WOUND CARE) ×1 IMPLANT
ELECT REM PT RETURN 9FT ADLT (ELECTROSURGICAL) ×1
ELECTRODE REM PT RTRN 9FT ADLT (ELECTROSURGICAL) ×1 IMPLANT
GAUZE PACKING 0.25INX5YD STRL (GAUZE/BANDAGES/DRESSINGS) ×1 IMPLANT
GAUZE SPONGE 4X4 12PLY STRL (GAUZE/BANDAGES/DRESSINGS) ×1 IMPLANT
GAUZE STRETCH 2X75IN STRL (MISCELLANEOUS) ×1 IMPLANT
GAUZE XEROFORM 1X8 LF (GAUZE/BANDAGES/DRESSINGS) ×1 IMPLANT
GLOVE BIO SURGEON STRL SZ7.5 (GLOVE) ×1 IMPLANT
GLOVE SURG UNDER LTX SZ8 (GLOVE) ×1 IMPLANT
GOWN STRL REUS W/ TWL XL LVL3 (GOWN DISPOSABLE) ×2 IMPLANT
GOWN STRL REUS W/TWL MED LVL3 (GOWN DISPOSABLE) ×1 IMPLANT
GOWN STRL REUS W/TWL XL LVL3 (GOWN DISPOSABLE) ×2
HANDPIECE VERSAJET DEBRIDEMENT (MISCELLANEOUS) IMPLANT
IV NS 1000ML (IV SOLUTION) ×1
IV NS 1000ML BAXH (IV SOLUTION) ×1 IMPLANT
IV NS IRRIG 3000ML ARTHROMATIC (IV SOLUTION) ×1 IMPLANT
KIT DRSG VAC SLVR GRANUFM (MISCELLANEOUS) ×1 IMPLANT
KIT TURNOVER KIT A (KITS) ×1 IMPLANT
LABEL OR SOLS (LABEL) ×1 IMPLANT
MANIFOLD NEPTUNE II (INSTRUMENTS) ×1 IMPLANT
NDL FILTER BLUNT 18X1 1/2 (NEEDLE) ×1 IMPLANT
NDL HYPO 25X1 1.5 SAFETY (NEEDLE) ×1 IMPLANT
NEEDLE FILTER BLUNT 18X1 1/2 (NEEDLE) ×1 IMPLANT
NEEDLE HYPO 25X1 1.5 SAFETY (NEEDLE) ×1 IMPLANT
NS IRRIG 500ML POUR BTL (IV SOLUTION) ×1 IMPLANT
PACK EXTREMITY ARMC (MISCELLANEOUS) ×1 IMPLANT
PACKING GAUZE IODOFORM 1INX5YD (GAUZE/BANDAGES/DRESSINGS) ×1 IMPLANT
PAD ABD DERMACEA PRESS 5X9 (GAUZE/BANDAGES/DRESSINGS) ×1 IMPLANT
PULSAVAC PLUS IRRIG FAN TIP (DISPOSABLE) ×1
RASP SM TEAR CROSS CUT (RASP) IMPLANT
SHIELD FULL FACE ANTIFOG 7M (MISCELLANEOUS) ×1 IMPLANT
STOCKINETTE IMPERVIOUS 9X36 MD (GAUZE/BANDAGES/DRESSINGS) ×1 IMPLANT
SUT ETHILON 2 0 FS 18 (SUTURE) ×2 IMPLANT
SUT ETHILON 4-0 (SUTURE)
SUT ETHILON 4-0 FS2 18XMFL BLK (SUTURE)
SUT VIC AB 3-0 SH 27 (SUTURE)
SUT VIC AB 3-0 SH 27X BRD (SUTURE) ×1 IMPLANT
SUT VIC AB 4-0 FS2 27 (SUTURE) ×1 IMPLANT
SUTURE ETHLN 4-0 FS2 18XMF BLK (SUTURE) ×1 IMPLANT
SWAB CULTURE AMIES ANAERIB BLU (MISCELLANEOUS) IMPLANT
SYR 10ML LL (SYRINGE) ×1 IMPLANT
SYR 3ML LL SCALE MARK (SYRINGE) ×1 IMPLANT
TIP FAN IRRIG PULSAVAC PLUS (DISPOSABLE) ×1 IMPLANT
TRAP FLUID SMOKE EVACUATOR (MISCELLANEOUS) ×1 IMPLANT
WATER STERILE IRR 500ML POUR (IV SOLUTION) ×1 IMPLANT

## 2022-08-11 NOTE — Assessment & Plan Note (Signed)
Sliding scale insulin coverage Hold metformin due to lactic acidosis of 3.6

## 2022-08-11 NOTE — Anesthesia Procedure Notes (Signed)
Procedure Name: LMA Insertion Date/Time: 08/11/2022 5:24 PM  Performed by: Lowry Bowl, CRNAPre-anesthesia Checklist: Patient identified, Emergency Drugs available, Suction available and Patient being monitored Patient Re-evaluated:Patient Re-evaluated prior to induction Oxygen Delivery Method: Circle system utilized Preoxygenation: Pre-oxygenation with 100% oxygen Induction Type: IV induction LMA: LMA inserted LMA Size: 5.0 Number of attempts: 1 Placement Confirmation: positive ETCO2 and breath sounds checked- equal and bilateral Tube secured with: Tape Dental Injury: Teeth and Oropharynx as per pre-operative assessment

## 2022-08-11 NOTE — Assessment & Plan Note (Signed)
Continue lisinopril HCTZ 

## 2022-08-11 NOTE — Progress Notes (Addendum)
Pharmacy Antibiotic Note  KASEAN DENHERDER is a 56 y.o. male admitted on 08/10/2022 with foot Cellulitis/abscess.  Pharmacy has been consulted for Vancomycin dosing.  Plan: Vancomycin 1500 gm IV x 1 given in ED on 9/26 @ 0005. Additional Vanc 1 gm IV ordered to make total loading dose of 2500 mg.   Scr improved 2.10>>1.56 Will adjust Vancomycin from 1250 mg to '1750mg'$  IV Q24H ordered for 9/27 @ 0000 .   AUC = 543 Vanc trough = 12.5 mcg/mL  VD 0.5   BMI 36   Height: 6' (182.9 cm) Weight: 122.5 kg (270 lb) IBW/kg (Calculated) : 77.6  Temp (24hrs), Avg:98.1 F (36.7 C), Min:97.5 F (36.4 C), Max:99.4 F (37.4 C)  Recent Labs  Lab 08/10/22 2010 08/10/22 2011 08/11/22 0224 08/11/22 1135  WBC 6.4  --   --   --   CREATININE 2.10*  --   --  1.56*  LATICACIDVEN  --  3.6* 2.5*  --      Estimated Creatinine Clearance: 71.5 mL/min (A) (by C-G formula based on SCr of 1.56 mg/dL (H)).    Allergies  Allergen Reactions   Erythromycin Other (See Comments)    Abdominal pain    Antimicrobials this admission:   Cefepime 9/25x1 Ceftriaxone 9/26 >> Vanc 9/26>>  Dose adjustments this admission:   Microbiology results:  BCx: NG  UCx:    Sputum:    MRSA PCR:  PCT 0.13 No signs of osteo  Thank you for allowing pharmacy to be a part of this patient's care.  Fredia Chittenden A 08/11/2022 2:06 PM

## 2022-08-11 NOTE — H&P (Addendum)
History and Physical    Patient: Chris Kline QMV:[id number] DOB: 27-Mar-1966 DOA: 08/10/2022 DOS: the patient was seen and examined on 08/11/2022 PCP: Sallee Lange, NP  Patient coming from: Home  Chief Complaint:  Chief Complaint  Patient presents with   Foot Pain    HPI: Chris Kline is a 56 y.o. male with medical history significant for Diabetes and prior left diabetic foot ulcer followed by podiatry who presents to the ED with a 2-week history of pain and swelling under the left great toe associated with drainage from an ulcerated area below the toe.  He also had subjective fever at home and decreased appetite. ED course and data review: Temperature 99.4 with pulse 95 and otherwise normal vitals.  CBC within normal limits but lactic acid 3.6.  Creatinine 2.10 up from baseline of 1.09 a year ago.  X-ray of the foot showed soft tissue swelling dorsal aspect of the first MTP joint with recommendation for MRI.   The emergency room provider spoke with podiatrist, Dr. Follow who recommended cefepime and vancomycin, MRI and admit to hospitalist.   Review of Systems: As mentioned in the history of present illness. All other systems reviewed and are negative.  Past Medical History:  Diagnosis Date   Diabetes mellitus without complication ([place])    type   History of kidney stones    Hypertension    Nephrolithiasis    Vitamin D deficiency    Past Surgical History:  Procedure Laterality Date   COLONOSCOPY WITH PROPOFOL N/A 02/02/2018   Procedure: COLONOSCOPY WITH PROPOFOL;  Surgeon: Manya Silvas, MD;  Location: [facility] ENDOSCOPY;  Service: Endoscopy;  Laterality: N/A;   IRRIGATION AND DEBRIDEMENT FOOT Left 08/28/2020   Procedure: IRRIGATION AND DEBRIDEMENT FOOT;  Surgeon: Samara Deist, DPM;  Location: [facility] ORS;  Service: Podiatry;  Laterality: Left;   NO PAST SURGERIES     WISDOM TOOTH EXTRACTION     Social History:  reports that he has never smoked. He has never used  smokeless tobacco. He reports that he does not drink alcohol and does not use drugs.  Allergies  Allergen Reactions   Erythromycin Other (See Comments)    Abdominal pain    Family History  Problem Relation Age of Onset   Heart attack Mother    Diabetes Mellitus II Father    Stroke Father    Heart attack Father    Lung cancer Sister     Prior to Admission medications   Medication Sig Start Date End Date Taking? Authorizing Provider  lisinopril-hydrochlorothiazide (ZESTORETIC) 20-25 MG tablet Take 1 tablet by mouth daily. 08/12/20  Yes [provider]  metFORMIN (GLUCOPHAGE) 500 MG tablet Take 2 tablets (1,000 mg total) by mouth 2 (two) times daily with a meal. 08/30/20 08/10/22 Yes Kayleen Memos, DO  acetaminophen (TYLENOL) 325 MG tablet Take 650 mg by mouth every 6 (six) hours as needed.    [provider]    Physical Exam: Vitals:   08/10/22 2005 08/10/22 2006 08/10/22 2155  BP: 120/84  127/76  Pulse: 95  89  Resp: 14  16  Temp: 99.4 F (37.4 C)    TempSrc: Oral    SpO2: 98%  98%  Weight:  122.5 kg   Height:  6' (1.829 m)    Physical Exam Vitals and nursing note reviewed.  Constitutional:      General: He is not in acute distress. HENT:     Head: Normocephalic and atraumatic.  Cardiovascular:  Rate and Rhythm: Normal rate and regular rhythm.     Heart sounds: Normal heart sounds.  Pulmonary:     Effort: Pulmonary effort is normal.     Breath sounds: Normal breath sounds.  Abdominal:     Palpations: Abdomen is soft.     Tenderness: There is no abdominal tenderness.  Musculoskeletal:     Comments: See pic below  Neurological:     Mental Status: Mental status is at baseline.      Labs on Admission: I have personally reviewed following labs and imaging studies  CBC: Recent Labs  Lab 08/10/22 2010  WBC 6.4  NEUTROABS 4.3  HGB 14.1  HCT 43.1  MCV 86.2  PLT 035   Basic Metabolic Panel: Recent Labs  Lab 08/10/22 2010  NA 134*   K 5.1  CL 97*  CO2 22  GLUCOSE 136*  BUN 43*  CREATININE 2.10*  CALCIUM 9.5   GFR: Estimated Creatinine Clearance: 53.1 mL/min (A) (by C-G formula based on SCr of 2.1 mg/dL (H)). Liver Function Tests: Recent Labs  Lab 08/10/22 2010  AST 44*  ALT 34  ALKPHOS 48  BILITOT 0.7  PROT 8.3*  ALBUMIN 4.4   No results for input(s): "LIPASE", "AMYLASE" in the last 168 hours. No results for input(s): "AMMONIA" in the last 168 hours. Coagulation Profile: No results for input(s): "INR", "PROTIME" in the last 168 hours. Cardiac Enzymes: No results for input(s): "CKTOTAL", "CKMB", "CKMBINDEX", "TROPONINI" in the last 168 hours. BNP (last 3 results) No results for input(s): "PROBNP" in the last 8760 hours. HbA1C: No results for input(s): "HGBA1C" in the last 72 hours. CBG: No results for input(s): "GLUCAP" in the last 168 hours. Lipid Profile: No results for input(s): "CHOL", "HDL", "LDLCALC", "TRIG", "CHOLHDL", "LDLDIRECT" in the last 72 hours. Thyroid Function Tests: No results for input(s): "TSH", "T4TOTAL", "FREET4", "T3FREE", "THYROIDAB" in the last 72 hours. Anemia Panel: No results for input(s): "VITAMINB12", "FOLATE", "FERRITIN", "TIBC", "IRON", "RETICCTPCT" in the last 72 hours. Urine analysis:    Component Value Date/Time   COLORURINE Yellow 06/08/2013 0618   APPEARANCEUR Clear 06/08/2013 0618   LABSPEC 1.017 06/08/2013 0618   PHURINE 5.0 06/08/2013 0618   GLUCOSEU Negative 06/08/2013 0618   HGBUR 3+ 06/08/2013 0618   BILIRUBINUR Negative 06/08/2013 0618   KETONESUR Negative 06/08/2013 0618   PROTEINUR 30 mg/dL 06/08/2013 0618   NITRITE Negative 06/08/2013 0618   LEUKOCYTESUR Trace 06/08/2013 0618    Radiological Exams on Admission: DG Foot Complete Left  Result Date: 08/10/2022 CLINICAL DATA:  Foot ulceration/infection. EXAM: LEFT FOOT - COMPLETE 3+ VIEW COMPARISON:  None Available. FINDINGS: There is no evidence of fracture or dislocation. There is no cortical  erosion or periosteal reaction to suggest osteomyelitis. Achilles enthesopathy. Soft tissue swelling about dorsum of the foot about the first metatarsophalangeal joint. IMPRESSION: No cortical erosion or periosteal reaction to suggest osteomyelitis. Soft tissue swelling about dorsal aspect of the first metatarsophalangeal joint, MRI examination could be considered for further evaluation if clinically warranted. Electronically Signed   By: Keane Police D.O.   On: 08/10/2022 20:39     Data Reviewed: Relevant notes from primary care and specialist visits, past discharge summaries as available in EHR, including Care Everywhere. Prior diagnostic testing as pertinent to current admission diagnoses Updated medications and problem lists for reconciliation ED course, including vitals, labs, imaging, treatment and response to treatment Triage notes, nursing and pharmacy notes and ED provider's notes Notable results as noted in HPI  Assessment and Plan: Diabetic ulcer of left foot ([place]) Possible sepsis Patient with infected diabetic foot ulcer, temp 99.4, tachycardic to 95 with lactic acid 3.6 IV hydration, continue IV vancomycin and cefepime started in the ED Podiatry consult Verbal MRI report from podiatry shows no osteomyelitis but does show fluid collection.  Official report pending  Diabetes mellitus, type 2 ([place]) Sliding scale insulin coverage Hold metformin due to lactic acidosis of 3.6  AKI (acute kidney injury) ([place]) Suspect acute and related to sepsis IV hydration, monitor renal function and avoid nephrotoxins  HTN (hypertension) Continue lisinopril HCTZ        DVT prophylaxis: Lovenox  Consults: Podiatry, Dr. Vickki Muff  Advance Care Planning:   Code Status: Prior   Family Communication: none  Disposition Plan: Back to previous home environment  Severity of Illness: The appropriate patient status for this patient is INPATIENT. Inpatient status is judged to be reasonable  and necessary in order to provide the required intensity of service to ensure the patient's safety. The patient's presenting symptoms, physical exam findings, and initial radiographic and laboratory data in the context of their chronic comorbidities is felt to place them at high risk for further clinical deterioration. Furthermore, it is not anticipated that the patient will be medically stable for discharge from the hospital within 2 midnights of admission.   * I certify that at the point of admission it is my clinical judgment that the patient will require inpatient hospital care spanning beyond 2 midnights from the point of admission due to high intensity of service, high risk for further deterioration and high frequency of surveillance required.*  Author: Athena Masse, MD 08/11/2022 12:00 AM  For on call review [email or web address].

## 2022-08-11 NOTE — Anesthesia Preprocedure Evaluation (Signed)
Anesthesia Evaluation  Patient identified by MRN, date of birth, ID band Patient awake    Reviewed: Allergy & Precautions, NPO status , Patient's Chart, lab work & pertinent test results  History of Anesthesia Complications (+) history of anesthetic complications  Airway Mallampati: II  TM Distance: >3 FB Neck ROM: Full    Dental no notable dental hx. (+) Teeth Intact, Dental Advisory Given   Pulmonary neg pulmonary ROS, neg shortness of breath, neg sleep apnea, neg COPD,    Pulmonary exam normal breath sounds clear to auscultation       Cardiovascular Exercise Tolerance: Good hypertension, negative cardio ROS Normal cardiovascular exam Rhythm:Regular Rate:Normal     Neuro/Psych neg Seizures  Neuromuscular disease (Diabetic neuropathy b/l feet ) negative psych ROS   GI/Hepatic negative GI ROS, Neg liver ROS, neg GERD  ,  Endo/Other  negative endocrine ROSdiabetes, Poorly Controlled, Type 2, Oral Hypoglycemic Agents  Renal/GU Renal diseaseAKI on admission    negative genitourinary   Musculoskeletal Chronic back pain, also reports numbness in back and legs "from sitting in the hospital bed all day"    Abdominal   Peds negative pediatric ROS (+)  Hematology negative hematology ROS (+)   Anesthesia Other Findings   Reproductive/Obstetrics negative OB ROS                             Anesthesia Physical Anesthesia Plan  ASA: 3  Anesthesia Plan: General   Post-op Pain Management: Toradol IV (intra-op)*, Ofirmev IV (intra-op)* and Dilaudid IV   Induction: Intravenous  PONV Risk Score and Plan: 2 and Ondansetron, Dexamethasone, Treatment may vary due to age or medical condition and Midazolam  Airway Management Planned: LMA  Additional Equipment:   Intra-op Plan:   Post-operative Plan: Extubation in OR  Informed Consent: I have reviewed the patients History and Physical, chart, labs  and discussed the procedure including the risks, benefits and alternatives for the proposed anesthesia with the patient or authorized representative who has indicated his/her understanding and acceptance.     Dental Advisory Given  Plan Discussed with: Anesthesiologist, CRNA and Surgeon  Anesthesia Plan Comments: (Patient consented for risks of anesthesia including but not limited to:  - adverse reactions to medications - damage to eyes, teeth, lips or other oral mucosa - nerve damage due to positioning  - sore throat or hoarseness - Damage to heart, brain, nerves, lungs, other parts of body or loss of life  Patient voiced understanding.)        Anesthesia Quick Evaluation

## 2022-08-11 NOTE — Progress Notes (Signed)
Anticoagulation monitoring(Lovenox):  56 yo male ordered Lovenox 40 mg Q24h    Filed Weights   08/10/22 2006  Weight: 122.5 kg (270 lb)   BMI 122.5 kg    Lab Results  Component Value Date   CREATININE 2.10 (H) 08/10/2022   CREATININE 1.18 08/29/2020   CREATININE 1.09 08/28/2020   Estimated Creatinine Clearance: 53.1 mL/min (A) (by C-G formula based on SCr of 2.1 mg/dL (H)). Hemoglobin & Hematocrit     Component Value Date/Time   HGB 14.1 08/10/2022 2010   HGB 13.8 06/08/2013 0618   HCT 43.1 08/10/2022 2010   HCT 42.6 06/08/2013 0618     Per Protocol for Patient with estCrcl > 30 ml/min and BMI > 30, will transition to Lovenox 62.5 mg Q24h.

## 2022-08-11 NOTE — ED Notes (Signed)
Pt to US at this time.

## 2022-08-11 NOTE — Plan of Care (Signed)

## 2022-08-11 NOTE — Anesthesia Postprocedure Evaluation (Signed)
Anesthesia Post Note  Patient: Chris Kline  Procedure(s) Performed: INCISION AND DRAINAGE OF FOOT ABSCESS (Left: Foot)  Patient location during evaluation: PACU Anesthesia Type: General Level of consciousness: awake and alert Pain management: pain level controlled Vital Signs Assessment: post-procedure vital signs reviewed and stable Respiratory status: spontaneous breathing, nonlabored ventilation, respiratory function stable and patient connected to nasal cannula oxygen Cardiovascular status: blood pressure returned to baseline and stable Postop Assessment: no apparent nausea or vomiting Anesthetic complications: no   No notable events documented.   Last Vitals:  Vitals:   08/11/22 1815 08/11/22 1826  BP: 111/60 117/80  Pulse: 76 66  Resp: 17 18  Temp:  37.2 C  SpO2: 96% 97%    Last Pain:  Vitals:   08/11/22 1826  TempSrc:   PainSc: 0-No pain                 Ilene Qua

## 2022-08-11 NOTE — Transfer of Care (Signed)
Immediate Anesthesia Transfer of Care Note  Patient: AQIB LOUGH  Procedure(s) Performed: INCISION AND DRAINAGE OF FOOT ABSCESS (Left: Foot)  Patient Location: PACU  Anesthesia Type:General  Level of Consciousness: awake, drowsy and patient cooperative  Airway & Oxygen Therapy: Patient Spontanous Breathing  Post-op Assessment: Report given to RN and Post -op Vital signs reviewed and stable  Post vital signs: Reviewed and stable; See PACU flowsheet for the rest of pt's vital signs.  Last Vitals:  Vitals Value Taken Time  BP    Temp 37.4 C 08/11/22 1807  Pulse 72 08/11/22 1807  Resp    SpO2      Last Pain:  Vitals:   08/11/22 1630  TempSrc: Temporal  PainSc: 0-No pain      Patients Stated Pain Goal: 0 (08/08/29 0762)  Complications: No notable events documented.

## 2022-08-11 NOTE — Progress Notes (Signed)
[facility] PLAN OF CARE NOTE Patient: Chris Kline DPT:[id number]   PCP: Sallee Lange, NP DOB: Feb 15, 1966   DOA: 08/10/2022   DOS: 08/11/2022    Patient was admitted by my colleague earlier on 08/11/2022. I have reviewed the H&P as well as assessment and plan and agree with the same. Important changes in the plan are listed below.  Plan of care: Principal Problem:   Sepsis ([place]) Active Problems:   Diabetic ulcer of left foot ([place])   HTN (hypertension)   AKI (acute kidney injury) ([place])   Diabetes mellitus, type 2 ([place])   Diabetic foot ulcer ([place]) Patient actually had kept himself n.p.o. after midnight.  Reached out to podiatry Dr. Vickki Muff.  Patient scheduled for irrigation and debridement at 4:30 PM today. Check ESR CRP CK Renal function panel shows improvement in AKI. Continue with IV hydration.  Author: Berle Mull, MD [facility] 08/11/2022 3:58 PM   If 7PM-7AM, please contact night-coverage at [email or web address]

## 2022-08-11 NOTE — Op Note (Signed)
Operative note   Surgeon:Albirtha Grinage    Assistant:none    Preop diagnosis: Infected abscess plantar left foot    Postop diagnosis: Infected bursal sac plantar left first MTPJ    Procedure: Incision and drainage infected bursal sac plantar left first MTPJ    EBL: Minimal    Anesthesia:local and general.  Local consisted of a total of 4 cc of 1% lidocaine with epinephrine and 4 cc of 0.5% bupivacaine    Hemostasis: Lidocaine with epinephrine    Specimen: Bursal sac plantar left first MTPJ 4 pathology and wound culture for microbiology    Complications: None    Operative indications:Chris Kline is an 56 y.o. that presents today for surgical intervention.  The risks/benefits/alternatives/complications have been discussed and consent has been given.    Procedure:  Patient was brought into the OR and placed on the operating table in thesupine position. After anesthesia was obtained theleft lower extremity was prepped and draped in usual sterile fashion.  Attention was directed to the plantar aspect the left foot where noted ulceration was found with purulent drainage.  An incision was performed directly under the MTPJ approximately 5 cm in length.  Full-thickness skin flaps were created.  At this time a large bursal type mass was found.  This was multilobulated.  Dissection was carried medial and lateral as well as deep to the plantar capsular ligament region.  At this time this was excised and sent for pathological examination.  A deep wound culture was performed.  The wound was flushed with copious amounts of irrigation.  No further purulent or infected tissue was noted at this time.  After further inspection and further flushing of the wound the skin was closed with a 2-0 nylon.  The central ulcerative site was packed with iodoform packing.  A bulky sterile dressing was applied to the left foot.    Patient tolerated the procedure and anesthesia well.  Was transported from the OR to  the PACU with all vital signs stable and vascular status intact. To be discharged per routine protocol.  Will follow up in approximately 1 week in the outpatient clinic.

## 2022-08-11 NOTE — Progress Notes (Signed)
Pt arrived to room 229 via bed after surgery. Received bedside report from Progress, RN in PACU. Pt AO X4. VSS. Call bell within reach.

## 2022-08-11 NOTE — Assessment & Plan Note (Signed)
Possible sepsis Patient with infected diabetic foot ulcer, temp 99.4, tachycardic to 95 with lactic acid 3.6 IV hydration, continue IV vancomycin and cefepime started in the ED Podiatry consult Verbal MRI report from podiatry shows no osteomyelitis but does show fluid collection.  Official report pending

## 2022-08-11 NOTE — ED Notes (Addendum)
Pt ambulatory to and from restroom with assistance. WCTM.

## 2022-08-11 NOTE — Progress Notes (Signed)
Pharmacy Antibiotic Note  Chris Kline is a 56 y.o. male admitted on 08/10/2022 with Cellulitis.  Pharmacy has been consulted for Vancomycin dosing.  Plan: Vancomycin 1500 gm IV x 1 given in ED on 9/26 @ 0005. Additional Vanc 1 gm IV ordered to make total loading dose of 2500 mg.   Vancomycin 1250 mg IV Q24H ordered for 9/27 @ 0000 .   AUC = 424.7 Vanc trough = 10 mcg/mL   Height: 6' (182.9 cm) Weight: 122.5 kg (270 lb) IBW/kg (Calculated) : 77.6  Temp (24hrs), Avg:98.5 F (36.9 C), Min:97.5 F (36.4 C), Max:99.4 F (37.4 C)  Recent Labs  Lab 08/10/22 2010 08/10/22 2011  WBC 6.4  --   CREATININE 2.10*  --   LATICACIDVEN  --  3.6*    Estimated Creatinine Clearance: 53.1 mL/min (A) (by C-G formula based on SCr of 2.1 mg/dL (H)).    Allergies  Allergen Reactions   Erythromycin Other (See Comments)    Abdominal pain    Antimicrobials this admission:   >>    >>   Dose adjustments this admission:   Microbiology results:  BCx:   UCx:    Sputum:    MRSA PCR:   Thank you for allowing pharmacy to be a part of this patient's care.  Tonjua Rossetti D 08/11/2022 2:09 AM

## 2022-08-11 NOTE — Consult Note (Signed)
ORTHOPAEDIC CONSULTATION  REQUESTING PHYSICIAN: Lavina Hamman, MD  Chief Complaint: Infection left foot  HPI: Chris Kline is a 56 y.o. male who complains of drainage from his left foot.  He noticed swelling and drainage starting last week sometime.  Has longstanding history of diabetic neuropathy.  He has had infections and ulcers in this area in the past and we have been able to clear this time.  Is been over a year since he has been seen in the clinic.  Admitted with noted drainage infection and MRI is consistent with abscess to the plantar aspect of the left foot.  No signs of osteomyelitis.  Past Medical History:  Diagnosis Date   Diabetes mellitus without complication ([place])    type   History of kidney stones    Hypertension    Nephrolithiasis    Vitamin D deficiency    Past Surgical History:  Procedure Laterality Date   COLONOSCOPY WITH PROPOFOL N/A 02/02/2018   Procedure: COLONOSCOPY WITH PROPOFOL;  Surgeon: Manya Silvas, MD;  Location: [facility] ENDOSCOPY;  Service: Endoscopy;  Laterality: N/A;   IRRIGATION AND DEBRIDEMENT FOOT Left 08/28/2020   Procedure: IRRIGATION AND DEBRIDEMENT FOOT;  Surgeon: Samara Deist, DPM;  Location: [facility] ORS;  Service: Podiatry;  Laterality: Left;   NO PAST SURGERIES     WISDOM TOOTH EXTRACTION     Social History   Socioeconomic History   Marital status: Single    Spouse name: Not on file   Number of children: Not on file   Years of education: Not on file   Highest education level: Not on file  Occupational History   Not on file  Tobacco Use   Smoking status: Never   Smokeless tobacco: Never  Vaping Use   Vaping Use: Never used  Substance and Sexual Activity   Alcohol use: No   Drug use: No   Sexual activity: Not on file  Other Topics Concern   Not on file  Social History Narrative   Not on file   Social Determinants of Health   Financial Resource Strain: Not on file  Food Insecurity: Not on file  Transportation  Needs: Not on file  Physical Activity: Not on file  Stress: Not on file  Social Connections: Not on file   Family History  Problem Relation Age of Onset   Heart attack Mother    Diabetes Mellitus II Father    Stroke Father    Heart attack Father    Lung cancer Sister    Allergies  Allergen Reactions   Erythromycin Other (See Comments)    Abdominal pain   Prior to Admission medications   Medication Sig Start Date End Date Taking? Authorizing Provider  lisinopril-hydrochlorothiazide (ZESTORETIC) 20-25 MG tablet Take 1 tablet by mouth daily. 08/12/20  Yes [provider]  metFORMIN (GLUCOPHAGE) 500 MG tablet Take 2 tablets (1,000 mg total) by mouth 2 (two) times daily with a meal. 08/30/20 08/10/22 Yes Kayleen Memos, DO  acetaminophen (TYLENOL) 325 MG tablet Take 650 mg by mouth every 6 (six) hours as needed.    [provider]   MR FOOT LEFT WO CONTRAST  Result Date: 08/11/2022 CLINICAL DATA:  Acute foot pain.  Limping.  Ulceration. EXAM: MRI OF THE LEFT FOOT WITHOUT CONTRAST TECHNIQUE: Multiplanar, multisequence MR imaging of the left foot was performed. No intravenous contrast was administered. COMPARISON:  08/27/2020 and radiographs 08/10/2022 FINDINGS: Despite efforts by the technologist and patient, motion artifact is present on today's exam  and could not be eliminated. This reduces exam sensitivity and specificity. Bones/Joint/Cartilage No osseous marrow edema to indicate osteomyelitis. Ligaments Lisfranc ligament appears grossly intact. Muscles and Tendons Low-level edema along the flexor digitorum brevis and flexor digitorum longus, for example on image 41 series 12, cannot exclude myositis although some of the appearance may be neurogenic. Soft tissues Plantar ulceration below the first MTP joint. Mildly complex cystic collection in this vicinity about 1.1 by 3.0 by 3.3 cm (volume = 5.7 cm^3). IMPRESSION: 1. Ulceration with underlying cystic collection along the  medial plantar foot below the first MTP joint. This extends to the margin of the first digit sesamoids, but no marrow edema in the sesamoids are adjacent bony structures to suggest active osteomyelitis at this time. 2. There is some edema tracking along the flexor musculature in the foot. This is low-grade in could reflect mild myositis or neurogenic edema. 3. Despite efforts by the technologist and patient, motion artifact is present on today's exam and could not be eliminated. This reduces exam sensitivity and specificity. Electronically Signed   By: Van Clines M.D.   On: 08/11/2022 08:00   DG Foot Complete Left  Result Date: 08/10/2022 CLINICAL DATA:  Foot ulceration/infection. EXAM: LEFT FOOT - COMPLETE 3+ VIEW COMPARISON:  None Available. FINDINGS: There is no evidence of fracture or dislocation. There is no cortical erosion or periosteal reaction to suggest osteomyelitis. Achilles enthesopathy. Soft tissue swelling about dorsum of the foot about the first metatarsophalangeal joint. IMPRESSION: No cortical erosion or periosteal reaction to suggest osteomyelitis. Soft tissue swelling about dorsal aspect of the first metatarsophalangeal joint, MRI examination could be considered for further evaluation if clinically warranted. Electronically Signed   By: Keane Police D.O.   On: 08/10/2022 20:39    Positive ROS: All other systems have been reviewed and were otherwise negative with the exception of those mentioned in the HPI and as above.  12 point ROS was performed.  Physical Exam: General: Alert and oriented.  No apparent distress.  Vascular:  Left foot:Dorsalis Pedis:  present Posterior Tibial:  present  Right foot: Dorsalis Pedis:  present Posterior Tibial:  present  Neuro:absent protective sensation  Derm: On the plantar aspect of the left first MTPJ where there is an abscessed area with draining purulence.  Mild erythema is noted.  Fluctuance is palpable.  Ortho/MS: Focal edema to  the plantar first MTPJ with fluctuance.  Purulence is noted from the region.  I personally reviewed the x-rays and MRI.  It shows fluid collection to the plantar left first MTPJ.  No signs of osteomyelitis.  Assessment: Diabetic foot ulcer with infection left foot Abscess plantar left foot  Plan: I discussed the need for operative I&D to the left foot today.  I discussed the risk benefits alternatives and complications associated with surgery.  He has been n.p.o. since midnight last night.  Orders have been placed.  We will plan on surgery later this afternoon.  He will need to be nonweightbearing afterwards and he understood this.  He understood the possible risk of need for further intervention down the road and/or primary closure if needed.    Elesa Hacker, DPM Cell [phone]   08/11/2022 12:31 PM

## 2022-08-11 NOTE — Assessment & Plan Note (Signed)
Suspect acute and related to sepsis IV hydration, monitor renal function and avoid nephrotoxins

## 2022-08-12 ENCOUNTER — Encounter: Payer: Self-pay | Admitting: Podiatry

## 2022-08-12 DIAGNOSIS — N179 Acute kidney failure, unspecified: Secondary | ICD-10-CM | POA: Diagnosis not present

## 2022-08-12 DIAGNOSIS — I10 Essential (primary) hypertension: Secondary | ICD-10-CM

## 2022-08-12 DIAGNOSIS — A419 Sepsis, unspecified organism: Secondary | ICD-10-CM | POA: Diagnosis not present

## 2022-08-12 DIAGNOSIS — R6520 Severe sepsis without septic shock: Secondary | ICD-10-CM | POA: Diagnosis not present

## 2022-08-12 LAB — CBC
HCT: 35.4 % — ABNORMAL LOW (ref 39.0–52.0)
Hemoglobin: 12 g/dL — ABNORMAL LOW (ref 13.0–17.0)
MCH: 28.8 pg (ref 26.0–34.0)
MCHC: 33.9 g/dL (ref 30.0–36.0)
MCV: 85.1 fL (ref 80.0–100.0)
Platelets: 149 10*3/uL — ABNORMAL LOW (ref 150–400)
RBC: 4.16 MIL/uL — ABNORMAL LOW (ref 4.22–5.81)
RDW: 12.9 % (ref 11.5–15.5)
WBC: 3.5 10*3/uL — ABNORMAL LOW (ref 4.0–10.5)
nRBC: 0 % (ref 0.0–0.2)

## 2022-08-12 LAB — GLUCOSE, CAPILLARY
Glucose-Capillary: 152 mg/dL — ABNORMAL HIGH (ref 70–99)
Glucose-Capillary: 136 mg/dL — ABNORMAL HIGH (ref 70–99)
Glucose-Capillary: 153 mg/dL — ABNORMAL HIGH (ref 70–99)
Glucose-Capillary: 208 mg/dL — ABNORMAL HIGH (ref 70–99)

## 2022-08-12 LAB — BASIC METABOLIC PANEL
Anion gap: 9 (ref 5–15)
BUN: 27 mg/dL — ABNORMAL HIGH (ref 6–20)
CO2: 20 mmol/L — ABNORMAL LOW (ref 22–32)
Calcium: 8.2 mg/dL — ABNORMAL LOW (ref 8.9–10.3)
Chloride: 106 mmol/L (ref 98–111)
Creatinine, Ser: 1.3 mg/dL — ABNORMAL HIGH (ref 0.61–1.24)
GFR, Estimated: >60 mL/min (ref >60)
Glucose, Bld: 173 mg/dL — ABNORMAL HIGH (ref 70–99)
Potassium: 4.9 mmol/L (ref 3.5–5.1)
Sodium: 135 mmol/L (ref 135–145)

## 2022-08-12 MED ORDER — VANCOMYCIN HCL IN DEXTROSE 1-5 GM/200ML-% IV SOLN
1000.0000 mg | Freq: Two times a day (BID) | INTRAVENOUS | Status: DC
  Administered 2022-08-13 (×2): 1000 mg via INTRAVENOUS
  Filled 2022-08-12 (×2): qty 200

## 2022-08-12 NOTE — Progress Notes (Addendum)
Pharmacy Antibiotic Note  Chris Kline is a 56 y.o. male admitted on 08/10/2022 with foot Cellulitis/abscess.  Pharmacy has been consulted for Vancomycin dosing.  Plan: Scr improved from 1.56 to 1.3 mg/dL Will adjust Vancomycin from '1750mg'$  IV Q24H to '1000mg'$  IV q12 ordered for 9/28 @ 0000.  Goal AUC 400-550  Est AUC: 525 Est Cmax: 31.0 Est Cmin: 15.7 Calculated with SCr 1.3  Vd coefficient of 0.5 L/kg    Height: 6' (182.9 cm) Weight: 122.5 kg (270 lb 1 oz) IBW/kg (Calculated) : 77.6  Temp (24hrs), Avg:98.1 F (36.7 C), Min:97.4 F (36.3 C), Max:99.3 F (37.4 C)  Recent Labs  Lab 08/10/22 2010 08/10/22 2011 08/11/22 0224 08/11/22 1135 08/12/22 0710  WBC 6.4  --   --   --  3.5*  CREATININE 2.10*  --   --  1.56* 1.30*  LATICACIDVEN  --  3.6* 2.5*  --   --      Estimated Creatinine Clearance: 85.8 mL/min (A) (by C-G formula based on SCr of 1.3 mg/dL (H)).    Allergies  Allergen Reactions   Erythromycin Other (See Comments)    Abdominal pain    Antimicrobials this admission: Cefepime 9/25x1 Ceftriaxone 9/26 >> Vanc 9/26>>  Dose adjustments this admission:   Microbiology results: 9/25 BCx: NGTD 9/26 Left Foot Wound cx: NGTD MRSA PCR: Positive No signs of active osteo on Left Foot MRI 9/26  Thank you for allowing pharmacy to be a part of this patient's care.  Darrick Penna 08/12/2022 8:42 AM

## 2022-08-12 NOTE — Progress Notes (Signed)
Daily Progress Note   Subjective  - 1 Day Post-Op  Follow-up I&D plantar left first MTPJ.  No complaints today  Objective Vitals:   08/11/22 1851 08/11/22 2042 08/12/22 0351 08/12/22 0740  BP: 115/75 131/74 (!) 144/83 134/86  Pulse: 66 76 (!) 58 (!) 58  Resp: '17 18 20 16  '$ Temp: 97.8 F (36.6 C) (!) 97.4 F (36.3 C) 97.9 F (36.6 C) 98.1 F (36.7 C)  TempSrc: Oral  Oral Oral  SpO2: 98% 97% 100% 100%  Weight:      Height:        Physical Exam: Incisions well coapted.  Central open ulcerative site packed open and the packing was removed.  No purulence noted.  Minimal to no erythema. Current cultures pending.   Laboratory CBC    Component Value Date/Time   WBC 3.5 (L) 08/12/2022 0710   HGB 12.0 (L) 08/12/2022 0710   HGB 13.8 06/08/2013 0618   HCT 35.4 (L) 08/12/2022 0710   HCT 42.6 06/08/2013 0618   PLT 149 (L) 08/12/2022 0710   PLT 261 06/08/2013 0618    BMET    Component Value Date/Time   NA 135 08/12/2022 0710   NA 136 06/08/2013 0618   K 4.9 08/12/2022 0710   K 4.5 06/08/2013 0618   CL 106 08/12/2022 0710   CL 103 06/08/2013 0618   CO2 20 (L) 08/12/2022 0710   CO2 29 06/08/2013 0618   GLUCOSE 173 (H) 08/12/2022 0710   GLUCOSE 189 (H) 06/08/2013 0618   BUN 27 (H) 08/12/2022 0710   BUN 12 06/08/2013 0618   CREATININE 1.30 (H) 08/12/2022 0710   CREATININE 1.31 (H) 06/08/2013 0618   CALCIUM 8.2 (L) 08/12/2022 0710   CALCIUM 9.4 06/08/2013 0618   GFRNONAA >60 08/12/2022 0710   GFRNONAA >60 06/08/2013 0618   GFRAA >60 02/28/2018 1404   GFRAA >60 06/08/2013 0618    Assessment/Planning: Status post I&D abscess plantar left foot Diabetes with neuropathy  We will continue to work on nonweightbearing.  This will be important.  We will order a OrthoWedge shoe to keep pressure off the front part of his foot. Awaiting cultures at this time.  Continue with broad-spectrum for now. Based on today's clinical appearance I do not suspect we will need to perform  further surgery at this time.  We will likely just perform wound care.  Patient feels comfortable to do dressing changes at home. I will follow-up tomorrow to make sure there is no signs of recurrent infection.  At that time things look to be nice and clean I suspect he will be stable from a podiatry standpoint for discharge.  Samara Deist A  08/12/2022, 12:39 PM

## 2022-08-12 NOTE — TOC Initial Note (Signed)
Transition of Care [facility]) - Initial/Assessment Note    Patient Details  Name: Chris Kline MRN: [id number] Date of Birth: 1965/12/31  [facility] of [facility]) CM/SW Contact:    Beverly Sessions, RN Phone Number: 08/12/2022, 4:10 PM  Clinical Narrative:                  Admitted for: s/p I&D foot Admitted from: home.  LDJ:[id number] Current home health/prior home health/DME: Patient states he has a RW, WC and BSC at home  Patient states his niece or nephew with transport at discharge Therapy recommending home health.  Patient politely declines  Per podiatry note dressing changes were discussed and patient will be managing his own dressings.  Patient confirms.  Request that bedside RN send some dressing change supplies home at time of discharge.  At this time it does not appear that home IV antibiotic therapy will be indicated         Patient Goals and CMS Choice        Expected Discharge Plan and Services                                                Prior Living Arrangements/Services                       Activities of Daily Living Home Assistive Devices/Equipment: CBG Meter ADL Screening (condition at time of admission) Patient's cognitive ability adequate to safely complete daily activities?: Yes Is the patient deaf or have difficulty hearing?: No Does the patient have difficulty seeing, even when wearing glasses/contacts?: No Does the patient have difficulty concentrating, remembering, or making decisions?: No Patient able to express need for assistance with ADLs?: Yes Does the patient have difficulty dressing or bathing?: No Independently performs ADLs?: Yes (appropriate for developmental age) Does the patient have difficulty walking or climbing stairs?: No Weakness of Legs: None Weakness of Arms/Hands: None  Permission Sought/Granted                  Emotional Assessment              Admission diagnosis:  Infection  [B99.9] Diabetic foot ulcer ([place]) [L39.030, L97.509] Diabetic infection of left foot ([place]) [S92.330, L08.9] Patient Active Problem List   Diagnosis Date Noted   Diabetic foot ulcer ([place]) 08/10/2022   Hyperlipidemia due to type 2 diabetes mellitus ([place]) 09/24/2020   Obesity (BMI 30-39.9) 09/24/2020   Diabetic ulcer of left foot ([place]) 08/27/2020   Left foot infection 08/27/2020   Sepsis ([place]) 08/27/2020   HTN (hypertension) 08/27/2020   AKI (acute kidney injury) ([place]) 08/27/2020   Diabetes mellitus, type 2 ([place]) 08/27/2020   Refusal of statin medication by patient 03/02/2019   Vitamin D insufficiency 10/16/2017   PCP:  Sallee Lange, NP Pharmacy:   [id number] [id number]: [phone]: [phone]    Social Determinants of Health (SDOH) Interventions    Readmission Risk Interventions     No data to display

## 2022-08-12 NOTE — Progress Notes (Addendum)
Progress Note  Patient: Chris Kline LTJ:[id number] DOB: 22-Oct-1966  DOA: 08/10/2022  DOS: 08/12/2022    Brief hospital course: Chris Kline is a 56 y.o. male with medical history significant for Diabetes and prior left diabetic foot ulcer followed by podiatry who presents to the ED with a 2-week history of pain and swelling under the left great toe associated with drainage from an ulcerated area below the toe.  He also had subjective fever at home and decreased appetite. ED course and data review: Temperature 99.4 with pulse 95 and otherwise normal vitals.  CBC within normal limits but lactic acid 3.6.  Creatinine 2.10 up from baseline of 1.09 a year ago.  X-ray of the foot showed soft tissue swelling dorsal aspect of the first MTP joint with recommendation for MRI.   The emergency room provider spoke with podiatrist, Dr. Follow who recommended cefepime and vancomycin, MRI and admit to hospitalist.   Assessment and Plan: Diabetic left foot ulcer with fluid collection: s/p I&D 9/26 by Dr. Vickki Muff, podiatry. ABIs 1.1 bilaterally. No osteomyelitis on MRI.  - Follow up wound culture (gram stain negative, blood cultures NGTD x2d), and surgical pathology results.  - Wound care per podiatry. Will continue education and reassess for possible DC 9/28.  - Continue IV antibiotics, defer outpatient plan to podiatry.   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  - Continue SSI  - Holding metformin with lactic acid elevation, AKI.   AKI: SCr 2.1 on arrival, improved to 1.3.  - Monitor while on vancomycin.   HTN:  - ACEi, HCTZ on hold with AKI  - Can stop IVF now that he's taking normal po.   Lactic acidosis: Improved - Hold metformin.   Pancytopenia: ?In response to infection and surgery.  - Monitor in AM.   Obesity: Estimated body mass index is 36.63 kg/m as calculated from the following:   Height as of this encounter: 6' (1.829 m).   Weight as of this encounter: 122.5 kg.  Subjective: Pain is controlled,  still learning wound care.   Objective: Vitals:   08/11/22 1851 08/11/22 2042 08/12/22 0351 08/12/22 0740  BP: 115/75 131/74 (!) 144/83 134/86  Pulse: 66 76 (!) 58 (!) 58  Resp: '17 18 20 16  '$ Temp: 97.8 F (36.6 C) (!) 97.4 F (36.3 C) 97.9 F (36.6 C) 98.1 F (36.7 C)  TempSrc: Oral  Oral Oral  SpO2: 98% 97% 100% 100%  Weight:      Height:       Gen: 56 y.o. male in no distress Pulm: Nonlabored breathing room air. Clear CV: Regular rate and rhythm. No murmur, rub, or gallop. No JVD, no dependent edema. GI: Abdomen soft, non-tender, non-distended, with normoactive bowel sounds.  Ext: Warm, no deformities Skin: Left foot wrapped c/d/I NVI. Neuro: Alert and oriented. No focal neurological deficits. Psych: Judgement and insight appear fair. Mood euthymic & affect congruent. Behavior is appropriate.    Data Personally reviewed: CBC: Recent Labs  Lab 08/10/22 2010 08/12/22 0710  WBC 6.4 3.5*  NEUTROABS 4.3  --   HGB 14.1 12.0*  HCT 43.1 35.4*  MCV 86.2 85.1  PLT 200 076*   Basic Metabolic Panel: Recent Labs  Lab 08/10/22 2010 08/11/22 1135 08/12/22 0710  NA 134* 134* 135  K 5.1 5.0 4.9  CL 97* 103 106  CO2 22 19* 20*  GLUCOSE 136* 120* 173*  BUN 43* 34* 27*  CREATININE 2.10* 1.56* 1.30*  CALCIUM 9.5 8.7* 8.2*  PHOS  --  3.2  --    GFR: Estimated Creatinine Clearance: 85.8 mL/min (A) (by C-G formula based on SCr of 1.3 mg/dL (H)). Liver Function Tests: Recent Labs  Lab 08/10/22 2010 08/11/22 1135  AST 44*  --   ALT 34  --   ALKPHOS 48  --   BILITOT 0.7  --   PROT 8.3*  --   ALBUMIN 4.4 3.9   No results for input(s): "LIPASE", "AMYLASE" in the last 168 hours. No results for input(s): "AMMONIA" in the last 168 hours. Coagulation Profile: Recent Labs  Lab 08/11/22 0657  INR 1.0   Cardiac Enzymes: Recent Labs  Lab 08/11/22 1319  CKTOTAL 375   BNP (last 3 results) No results for input(s): "PROBNP" in the last 8760 hours. HbA1C: Recent Labs     08/10/22 2010  HGBA1C 7.1*   CBG: Recent Labs  Lab 08/11/22 1633 08/11/22 1808 08/11/22 2051 08/12/22 0741 08/12/22 1143  GLUCAP 113* 132* 256* 152* 136*   Lipid Profile: No results for input(s): "CHOL", "HDL", "LDLCALC", "TRIG", "CHOLHDL", "LDLDIRECT" in the last 72 hours. Thyroid Function Tests: No results for input(s): "TSH", "T4TOTAL", "FREET4", "T3FREE", "THYROIDAB" in the last 72 hours. Anemia Panel: No results for input(s): "VITAMINB12", "FOLATE", "FERRITIN", "TIBC", "IRON", "RETICCTPCT" in the last 72 hours. Urine analysis:    Component Value Date/Time   COLORURINE Yellow 06/08/2013 0618   APPEARANCEUR Clear 06/08/2013 0618   LABSPEC 1.017 06/08/2013 0618   PHURINE 5.0 06/08/2013 0618   GLUCOSEU Negative 06/08/2013 0618   HGBUR 3+ 06/08/2013 0618   BILIRUBINUR Negative 06/08/2013 0618   KETONESUR Negative 06/08/2013 0618   PROTEINUR 30 mg/dL 06/08/2013 0618   NITRITE Negative 06/08/2013 0618   LEUKOCYTESUR Trace 06/08/2013 0618   Recent Results (from the past 240 hour(s))  Blood culture (single)     Status: None (Preliminary result)   Collection Time: 08/10/22  8:11 PM   Specimen: BLOOD  Result Value Ref Range Status   Specimen Description BLOOD RIGHT ANTECUBITAL  Final   Special Requests   Final    BOTTLES DRAWN AEROBIC AND ANAEROBIC Blood Culture results may not be optimal due to an excessive volume of blood received in culture bottles   Culture   Final    NO GROWTH 2 DAYS Performed at [facility], [address], [place], [place] [address]    Report Status PENDING  Incomplete  Blood culture (routine x 2)     Status: None (Preliminary result)   Collection Time: 08/10/22  9:25 PM   Specimen: BLOOD  Result Value Ref Range Status   Specimen Description BLOOD RIGHT HAND  Final   Special Requests   Final    BOTTLES DRAWN AEROBIC AND ANAEROBIC Blood Culture adequate volume   Culture   Final    NO GROWTH 2 DAYS Performed at [facility], [address], [place], [place] [address]    Report Status PENDING  Incomplete  Blood culture (routine x 2)     Status: None (Preliminary result)   Collection Time: 08/10/22  9:40 PM   Specimen: BLOOD  Result Value Ref Range Status   Specimen Description BLOOD LEFT ASSIST CONTROL  Final   Special Requests   Final    BOTTLES DRAWN AEROBIC ONLY Blood Culture adequate volume   Culture   Final    NO GROWTH 2 DAYS Performed at [facility], [address], [place], [place] [address]    Report Status PENDING  Incomplete  Surgical PCR screen  Status: Abnormal   Collection Time: 08/11/22 12:41 PM   Specimen: Nasal Mucosa; Nasal Swab  Result Value Ref Range Status   MRSA, PCR NEGATIVE NEGATIVE Final   Staphylococcus aureus POSITIVE (A) NEGATIVE Final    Comment: (NOTE) The Xpert SA Assay (FDA approved for NASAL specimens in patients 74 years of age and older), is one component of a comprehensive surveillance program. It is not intended to diagnose infection nor to guide or monitor treatment. Performed at [facility], [address] [place], [place] [address]   Aerobic/Anaerobic Culture w Gram Stain (surgical/deep wound)     Status: None (Preliminary result)   Collection Time: 08/11/22  5:37 PM   Specimen: PATH Other; Tissue  Result Value Ref Range Status   Specimen Description   Final    TOE Performed at [facility], [address], [place], [place] [address]    Special Requests   Final    LEFT INFECTED TOE Performed at [facility], [place], [place] [address]    Gram Stain NO WBC SEEN NO ORGANISMS SEEN   Final   Culture   Final    NO GROWTH < 12 HOURS Performed at [facility], [place] [address] [place], [place] [address]    Report Status PENDING  Incomplete     DG MINI C-ARM IMAGE ONLY  Result Date: 08/11/2022 There is no interpretation for this exam.  This order is for images obtained during a  surgical procedure.  Please See "Surgeries" Tab for more information regarding the procedure.   US ARTERIAL ABI (SCREENING LOWER EXTREMITY)  Result Date: 08/11/2022 CLINICAL DATA:  Cellulitis, peripheral vascular disease EXAM: NONINVASIVE PHYSIOLOGIC VASCULAR STUDY OF BILATERAL LOWER EXTREMITIES TECHNIQUE: Evaluation of both lower extremities were performed at rest, including calculation of ankle-brachial indices with single level Doppler, pressure and pulse volume recording. COMPARISON:  None Available. FINDINGS: Right ABI:  1.16 Left ABI:  1.14 Right Lower Extremity:  Multiphasic arterial waveforms at the ankle. Left Lower Extremity:  Multiphasic arterial waveforms at the ankle. IMPRESSION: No evidence of hemodynamically significant lower extremity arterial occlusive disease at rest. Electronically Signed   By: Lucrezia Europe M.D.   On: 08/11/2022 15:37   MR FOOT LEFT WO CONTRAST  Result Date: 08/11/2022 CLINICAL DATA:  Acute foot pain.  Limping.  Ulceration. EXAM: MRI OF THE LEFT FOOT WITHOUT CONTRAST TECHNIQUE: Multiplanar, multisequence MR imaging of the left foot was performed. No intravenous contrast was administered. COMPARISON:  08/27/2020 and radiographs 08/10/2022 FINDINGS: Despite efforts by the technologist and patient, motion artifact is present on today's exam and could not be eliminated. This reduces exam sensitivity and specificity. Bones/Joint/Cartilage No osseous marrow edema to indicate osteomyelitis. Ligaments Lisfranc ligament appears grossly intact. Muscles and Tendons Low-level edema along the flexor digitorum brevis and flexor digitorum longus, for example on image 41 series 12, cannot exclude myositis although some of the appearance may be neurogenic. Soft tissues Plantar ulceration below the first MTP joint. Mildly complex cystic collection in this vicinity about 1.1 by 3.0 by 3.3 cm (volume = 5.7 cm^3). IMPRESSION: 1. Ulceration with underlying cystic collection along the medial  plantar foot below the first MTP joint. This extends to the margin of the first digit sesamoids, but no marrow edema in the sesamoids are adjacent bony structures to suggest active osteomyelitis at this time. 2. There is some edema tracking along the flexor musculature in the foot. This is low-grade in could reflect mild myositis or  neurogenic edema. 3. Despite efforts by the technologist and patient, motion artifact is present on today's exam and could not be eliminated. This reduces exam sensitivity and specificity. Electronically Signed   By: Van Clines M.D.   On: 08/11/2022 08:00   DG Foot Complete Left  Result Date: 08/10/2022 CLINICAL DATA:  Foot ulceration/infection. EXAM: LEFT FOOT - COMPLETE 3+ VIEW COMPARISON:  None Available. FINDINGS: There is no evidence of fracture or dislocation. There is no cortical erosion or periosteal reaction to suggest osteomyelitis. Achilles enthesopathy. Soft tissue swelling about dorsum of the foot about the first metatarsophalangeal joint. IMPRESSION: No cortical erosion or periosteal reaction to suggest osteomyelitis. Soft tissue swelling about dorsal aspect of the first metatarsophalangeal joint, MRI examination could be considered for further evaluation if clinically warranted. Electronically Signed   By: Keane Police D.O.   On: 08/10/2022 20:39    Family Communication: None at bedside. His mother is in the ED.  Disposition: Status is: Inpatient Remains inpatient appropriate because: Severity of illness Planned Discharge Destination: Home with [place], MD 08/12/2022 4:02 PM Page by [email or web address]

## 2022-08-12 NOTE — Evaluation (Signed)
Physical Therapy Evaluation Patient Details Name: Chris Kline MRN: [id number] DOB: [birth year] Today's Date: 08/12/2022  History of Present Illness  Patient is a 56 year old male with Infected abscess plantar left foot s/p incision and drainage infected bursal sac plantar left first MTPJ. History of diabetes mellitus and diabetic neuropathy.   Clinical Impression  The patient was agreeable to PT evaluation. He reports he is usually independent with mobility at baseline. 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   The patient has extensive DME at home already, including a ramp, wheelchair, bed side commode, and rolling walker. He was able to maintain NWB of LLE with transfers and taking a few steps with the rolling walker. Recommend PT follow up while in the hospital to maximize independence within the limits of his weight bearing restrictions. He could benefit from home health PT, but he was not sure if he wants that and is planning to do his own dressing changes at home.      Recommendations for follow up therapy are one component of a multi-disciplinary discharge planning process, led by the attending physician.  Recommendations may be updated based on patient status, additional functional criteria and insurance authorization.  Follow Up Recommendations Home health PT      Assistance Recommended at Discharge Set up Supervision/Assistance  Patient can return home with the following  Assist for transportation;Assistance with cooking/housework    Equipment Recommendations None recommended by PT  Recommendations for Other Services       Functional Status Assessment Patient has had a recent decline in their functional status and demonstrates the ability to make significant improvements in function in a reasonable and predictable amount of time.     Precautions / Restrictions Precautions Precautions:  Fall Restrictions Weight Bearing Restrictions: Yes LLE Weight Bearing: Non weight bearing      Mobility  Bed Mobility Overal bed mobility: Modified Independent Bed Mobility: Supine to Sit, Sit to Supine     Supine to sit: Modified independent (Device/Increase time), HOB elevated Sit to supine: Modified independent (Device/Increase time), HOB elevated        Transfers Overall transfer level: Needs assistance Equipment used: Rolling walker (2 wheels) Transfers: Sit to/from Stand Sit to Stand: Min assist           General transfer comment: steadying assistance provided with standing. with initial verbal cues for LLE positioning with sitting and standing, patient was able to maintain NWB of LLE throughout    Ambulation/Gait Ambulation/Gait assistance: Min guard Gait Distance (Feet): 3 Feet Assistive device: Rolling walker (2 wheels)   Gait velocity: decreased     General Gait Details: hop steps taken forwards and backwards using rolling walker. patient is able to maintain NWB of LLE with all standing activity  Stairs            Wheelchair Mobility    Modified Rankin (Stroke Patients Only)       Balance Overall balance assessment: Needs assistance Sitting-balance support: Feet supported Sitting balance-Leahy Scale: Good     Standing balance support: Bilateral upper extremity supported, Reliant on assistive device for balance Standing balance-Leahy Scale:  (poor initially progressing to fair) Standing balance comment: steadying assistance provided initially                             Pertinent Vitals/Pain Pain Assessment Pain Assessment: No/denies pain  Home Living Family/patient expects to be discharged to:: Private residence Living Arrangements: Parent Available Help at Discharge: Family;Available PRN/intermittently Type of Home: House Home Access: Ramped entrance       Home Layout: One level Home Equipment: Conservation officer, nature (2  wheels);Wheelchair - manual;BSC/3in1 Additional Comments: patient is the caregiver for his mother who is being admitted into SNF today.    Prior Function Prior Level of Function : Independent/Modified Independent;Driving                     Hand Dominance        Extremity/Trunk Assessment   Upper Extremity Assessment Upper Extremity Assessment: Overall WFL for tasks assessed    Lower Extremity Assessment Lower Extremity Assessment: Overall WFL for tasks assessed       Communication   Communication: No difficulties  Cognition Arousal/Alertness: Awake/alert Behavior During Therapy: WFL for tasks assessed/performed Overall Cognitive Status: Within Functional Limits for tasks assessed                                          General Comments      Exercises     Assessment/Plan    PT Assessment Patient needs continued PT services  PT Problem List Decreased activity tolerance;Decreased balance;Decreased mobility;Decreased knowledge of use of DME;Decreased safety awareness;Decreased knowledge of precautions       PT Treatment Interventions DME instruction;Gait training;Functional mobility training;Therapeutic activities;Balance training;Therapeutic exercise;Neuromuscular re-education;Cognitive remediation    PT Goals (Current goals can be found in the Care Plan section)  Acute Rehab PT Goals Patient Stated Goal: to go home PT Goal Formulation: With patient Time For Goal Achievement: 08/26/22 Potential to Achieve Goals: Fair    Frequency Min 2X/week     Co-evaluation               AM-PAC PT "6 Clicks" Mobility  Outcome Measure Help needed turning from your back to your side while in a flat bed without using bedrails?: None Help needed moving from lying on your back to sitting on the side of a flat bed without using bedrails?: None Help needed moving to and from a bed to a chair (including a wheelchair)?: A Little Help needed standing  up from a chair using your arms (e.g., wheelchair or bedside chair)?: A Little Help needed to walk in hospital room?: A Little Help needed climbing 3-5 steps with a railing? : A Lot 6 Click Score: 19    End of Session   Activity Tolerance: Patient tolerated treatment well Patient left: in bed;with call bell/phone within reach;with bed alarm set   PT Visit Diagnosis: Unsteadiness on feet (R26.81);Other abnormalities of gait and mobility (R26.89)    Time: 1045-1106 PT Time Calculation (min) (ACUTE ONLY): 21 min   Charges:   PT Evaluation $PT Eval Low Complexity: 1 Low PT Treatments $Gait Training: 8-22 mins        Minna Merritts, PT, MPT   Percell Locus 08/12/2022, 1:41 PM

## 2022-08-12 NOTE — Plan of Care (Signed)

## 2022-08-12 NOTE — Plan of Care (Signed)
Problem: Education: Goal: Ability to describe self-care measures that may prevent or decrease complications (Diabetes Survival Skills Education) will improve 08/12/2022 0236 by Jule Ser, RN Outcome: Progressing 08/11/2022 2244 by Jule Ser, RN Outcome: Progressing Goal: Individualized Educational Video(s) 08/12/2022 0236 by Jule Ser, RN Outcome: Progressing 08/11/2022 2244 by Jule Ser, RN Outcome: Progressing   Problem: Coping: Goal: Ability to adjust to condition or change in health will improve 08/12/2022 0236 by Jule Ser, RN Outcome: Progressing 08/11/2022 2244 by Jule Ser, RN Outcome: Progressing   Problem: Fluid Volume: Goal: Ability to maintain a balanced intake and output will improve 08/12/2022 0236 by Jule Ser, RN Outcome: Progressing 08/11/2022 2244 by Jule Ser, RN Outcome: Progressing   Problem: Health Behavior/Discharge Planning: Goal: Ability to identify and utilize available resources and services will improve 08/12/2022 0236 by Jule Ser, RN Outcome: Progressing 08/11/2022 2244 by Jule Ser, RN Outcome: Progressing Goal: Ability to manage health-related needs will improve 08/12/2022 0236 by Jule Ser, RN Outcome: Progressing 08/11/2022 2244 by Jule Ser, RN Outcome: Progressing   Problem: Metabolic: Goal: Ability to maintain appropriate glucose levels will improve 08/12/2022 0236 by Jule Ser, RN Outcome: Progressing 08/11/2022 2244 by Jule Ser, RN Outcome: Progressing   Problem: Nutritional: Goal: Maintenance of adequate nutrition will improve 08/12/2022 0236 by Jule Ser, RN Outcome: Progressing 08/11/2022 2244 by Jule Ser, RN Outcome: Progressing Goal: Progress toward achieving an optimal weight will improve 08/12/2022 0236 by Jule Ser, RN Outcome: Progressing 08/11/2022 2244 by Jule Ser, RN Outcome: Progressing   Problem:  Skin Integrity: Goal: Risk for impaired skin integrity will decrease 08/12/2022 0236 by Jule Ser, RN Outcome: Progressing 08/11/2022 2244 by Jule Ser, RN Outcome: Progressing   Problem: Tissue Perfusion: Goal: Adequacy of tissue perfusion will improve 08/12/2022 0236 by Jule Ser, RN Outcome: Progressing 08/11/2022 2244 by Jule Ser, RN Outcome: Progressing   Problem: Fluid Volume: Goal: Hemodynamic stability will improve 08/12/2022 0236 by Jule Ser, RN Outcome: Progressing 08/11/2022 2244 by Jule Ser, RN Outcome: Progressing   Problem: Clinical Measurements: Goal: Diagnostic test results will improve 08/12/2022 0236 by Jule Ser, RN Outcome: Progressing 08/11/2022 2244 by Jule Ser, RN Outcome: Progressing Goal: Signs and symptoms of infection will decrease 08/12/2022 0236 by Jule Ser, RN Outcome: Progressing 08/11/2022 2244 by Jule Ser, RN Outcome: Progressing   Problem: Respiratory: Goal: Ability to maintain adequate ventilation will improve 08/12/2022 0236 by Jule Ser, RN Outcome: Progressing 08/11/2022 2244 by Jule Ser, RN Outcome: Progressing   Problem: Education: Goal: Knowledge of General Education information will improve Description: Including pain rating scale, medication(s)/side effects and non-pharmacologic comfort measures 08/12/2022 0236 by Jule Ser, RN Outcome: Progressing 08/11/2022 2244 by Jule Ser, RN Outcome: Progressing   Problem: Health Behavior/Discharge Planning: Goal: Ability to manage health-related needs will improve 08/12/2022 0236 by Jule Ser, RN Outcome: Progressing 08/11/2022 2244 by Jule Ser, RN Outcome: Progressing   Problem: Clinical Measurements: Goal: Ability to maintain clinical measurements within normal limits will improve 08/12/2022 0236 by Jule Ser, RN Outcome: Progressing 08/11/2022 2244 by Jule Ser, RN Outcome: Progressing Goal: Will remain free from infection 08/12/2022 0236 by Jule Ser, RN Outcome: Progressing 08/11/2022 2244 by Jule Ser, RN Outcome: Progressing Goal: Diagnostic test results will improve 08/12/2022 0236 by Jule Ser, RN Outcome: Progressing 08/11/2022  2244 by Jule Ser, RN Outcome: Progressing Goal: Respiratory complications will improve 08/12/2022 0236 by Jule Ser, RN Outcome: Progressing 08/11/2022 2244 by Jule Ser, RN Outcome: Progressing Goal: Cardiovascular complication will be avoided 08/12/2022 0236 by Jule Ser, RN Outcome: Progressing 08/11/2022 2244 by Jule Ser, RN Outcome: Progressing   Problem: Activity: Goal: Risk for activity intolerance will decrease 08/12/2022 0236 by Jule Ser, RN Outcome: Progressing 08/11/2022 2244 by Jule Ser, RN Outcome: Progressing   Problem: Nutrition: Goal: Adequate nutrition will be maintained 08/12/2022 0236 by Jule Ser, RN Outcome: Progressing 08/11/2022 2244 by Jule Ser, RN Outcome: Progressing   Problem: Coping: Goal: Level of anxiety will decrease 08/12/2022 0236 by Jule Ser, RN Outcome: Progressing 08/11/2022 2244 by Jule Ser, RN Outcome: Progressing   Problem: Elimination: Goal: Will not experience complications related to bowel motility 08/12/2022 0236 by Jule Ser, RN Outcome: Progressing 08/11/2022 2244 by Jule Ser, RN Outcome: Progressing Goal: Will not experience complications related to urinary retention 08/12/2022 0236 by Jule Ser, RN Outcome: Progressing 08/11/2022 2244 by Jule Ser, RN Outcome: Progressing   Problem: Pain Managment: Goal: General experience of comfort will improve 08/12/2022 0236 by Jule Ser, RN Outcome: Progressing 08/11/2022 2244 by Jule Ser, RN Outcome: Progressing   Problem: Safety: Goal: Ability to remain free from  injury will improve 08/12/2022 0236 by Jule Ser, RN Outcome: Progressing 08/11/2022 2244 by Jule Ser, RN Outcome: Progressing   Problem: Skin Integrity: Goal: Risk for impaired skin integrity will decrease 08/12/2022 0236 by Jule Ser, RN Outcome: Progressing 08/11/2022 2244 by Jule Ser, RN Outcome: Progressing

## 2022-08-13 DIAGNOSIS — L97529 Non-pressure chronic ulcer of other part of left foot with unspecified severity: Secondary | ICD-10-CM

## 2022-08-13 DIAGNOSIS — N170 Acute kidney failure with tubular necrosis: Secondary | ICD-10-CM | POA: Diagnosis not present

## 2022-08-13 DIAGNOSIS — I10 Essential (primary) hypertension: Secondary | ICD-10-CM | POA: Diagnosis not present

## 2022-08-13 DIAGNOSIS — A419 Sepsis, unspecified organism: Secondary | ICD-10-CM | POA: Diagnosis not present

## 2022-08-13 DIAGNOSIS — E11621 Type 2 diabetes mellitus with foot ulcer: Secondary | ICD-10-CM

## 2022-08-13 DIAGNOSIS — R6520 Severe sepsis without septic shock: Secondary | ICD-10-CM | POA: Diagnosis not present

## 2022-08-13 DIAGNOSIS — N179 Acute kidney failure, unspecified: Secondary | ICD-10-CM | POA: Diagnosis not present

## 2022-08-13 LAB — CBC
HCT: 35.3 % — ABNORMAL LOW (ref 39.0–52.0)
Hemoglobin: 11.5 g/dL — ABNORMAL LOW (ref 13.0–17.0)
MCH: 28.3 pg (ref 26.0–34.0)
MCHC: 32.6 g/dL (ref 30.0–36.0)
MCV: 86.7 fL (ref 80.0–100.0)
Platelets: 167 10*3/uL (ref 150–400)
RBC: 4.07 MIL/uL — ABNORMAL LOW (ref 4.22–5.81)
RDW: 13 % (ref 11.5–15.5)
WBC: 5 10*3/uL (ref 4.0–10.5)
nRBC: 0 % (ref 0.0–0.2)

## 2022-08-13 LAB — BASIC METABOLIC PANEL
Anion gap: 5 (ref 5–15)
BUN: 25 mg/dL — ABNORMAL HIGH (ref 6–20)
CO2: 26 mmol/L (ref 22–32)
Calcium: 8.3 mg/dL — ABNORMAL LOW (ref 8.9–10.3)
Chloride: 106 mmol/L (ref 98–111)
Creatinine, Ser: 1.25 mg/dL — ABNORMAL HIGH (ref 0.61–1.24)
GFR, Estimated: >60 mL/min (ref >60)
Glucose, Bld: 127 mg/dL — ABNORMAL HIGH (ref 70–99)
Potassium: 4.3 mmol/L (ref 3.5–5.1)
Sodium: 137 mmol/L (ref 135–145)

## 2022-08-13 LAB — GLUCOSE, CAPILLARY
Glucose-Capillary: 93 mg/dL (ref 70–99)
Glucose-Capillary: 120 mg/dL — ABNORMAL HIGH (ref 70–99)

## 2022-08-13 LAB — SURGICAL PATHOLOGY

## 2022-08-13 MED ORDER — DOXYCYCLINE HYCLATE 100 MG PO CAPS: 100.0000 mg | ORAL_CAPSULE | Freq: Two times a day (BID) | ORAL | 0 refills | Status: AC

## 2022-08-13 NOTE — Progress Notes (Signed)
Daily Progress Note   Subjective  - 2 Days Post-Op  Follow-up left foot I&D.  Doing well.  No complaints.  Physical therapy has been working with him.  No recommendations for home health PT at this time.  Objective Vitals:   08/12/22 1715 08/12/22 2128 08/13/22 0529 08/13/22 0818  BP: (!) 140/91 (!) 120/51 134/81 (!) 157/91  Pulse: (!) 59 (!) 51 (!) 52 64  Resp: '16 18 18 18  '$ Temp: 98.1 F (36.7 C) 97.8 F (36.6 C) 97.8 F (36.6 C) 97.9 F (36.6 C)  TempSrc:   Oral Oral  SpO2: 100% 99% 99% 99%  Weight:      Height:        Physical Exam: Wound continues to be stable.  Incisions well coapted.  Central open ulceration noted without purulence.  Minimal to no surrounding erythema.  Laboratory CBC    Component Value Date/Time   WBC 5.0 08/13/2022 0410   HGB 11.5 (L) 08/13/2022 0410   HGB 13.8 06/08/2013 0618   HCT 35.3 (L) 08/13/2022 0410   HCT 42.6 06/08/2013 0618   PLT 167 08/13/2022 0410   PLT 261 06/08/2013 0618    BMET    Component Value Date/Time   NA 137 08/13/2022 0410   NA 136 06/08/2013 0618   K 4.3 08/13/2022 0410   K 4.5 06/08/2013 0618   CL 106 08/13/2022 0410   CL 103 06/08/2013 0618   CO2 26 08/13/2022 0410   CO2 29 06/08/2013 0618   GLUCOSE 127 (H) 08/13/2022 0410   GLUCOSE 189 (H) 06/08/2013 0618   BUN 25 (H) 08/13/2022 0410   BUN 12 06/08/2013 0618   CREATININE 1.25 (H) 08/13/2022 0410   CREATININE 1.31 (H) 06/08/2013 0618   CALCIUM 8.3 (L) 08/13/2022 0410   CALCIUM 9.4 06/08/2013 0618   GFRNONAA >60 08/13/2022 0410   GFRNONAA >60 06/08/2013 0618   GFRAA >60 02/28/2018 1404   GFRAA >60 06/08/2013 0618  Culture showing Staph aureus at this time.  Sensitivities are still pending.  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 I would recommend doxycycline for 10 days upon discharge.  We will monitor culture and can change as needed. I discussed with the  patient wound dressings.  He has had a home health nurse in the past perform dressings but he would prefer to perform this himself.  This would be a very simple dressing and he felt comfortable to perform this.  I outlined painting the area with Betadine and covering with dry gauze padded dressing.  He can perform this every day to every other day. At this point stable for discharge from podiatry standpoint.  He can follow-up with me in 1 week in the outpatient clinic.   Samara Deist A  08/13/2022, 12:55 PM

## 2022-08-13 NOTE — Progress Notes (Signed)
Physical Therapy Treatment Patient Details Name: Chris Kline MRN: [id number] DOB: [birth year] Today's Date: 08/13/2022   History of Present Illness Patient is a 56 year old male with Infected abscess plantar left foot s/p incision and drainage infected bursal sac plantar left first MTPJ. History of diabetes mellitus and diabetic neuropathy.    PT Comments    Patient agreeable to PT session. He is hopeful to discharge home today. Transfer training continued today. Patient has increased independence with transfers with repeated standing bouts using rolling walker with initial instruction on safe hand placement. Patient was able to maintain NWB of LLE with transfers and standing without difficulty. Educated patient on energy conservation tips. The patient is planning to use wheelchair for primary means of mobility while NWB and has DME at home already.     Recommendations for follow up therapy are one component of a multi-disciplinary discharge planning process, led by the attending physician.  Recommendations may be updated based on patient status, additional functional criteria and insurance authorization.  Follow Up Recommendations  Home health PT     Assistance Recommended at Discharge Set up Supervision/Assistance  Patient can return home with the following Assist for transportation;Assistance with cooking/housework   Equipment Recommendations  None recommended by PT    Recommendations for Other Services       Precautions / Restrictions Precautions Precautions: Fall Restrictions Weight Bearing Restrictions: Yes LLE Weight Bearing: Non weight bearing     Mobility  Bed Mobility               General bed mobility comments: patient sitting up on edge of bed on arrival to the room    Transfers Overall transfer level: Needs assistance Equipment used: Rolling walker (2 wheels) Transfers: Sit to/from Stand Sit to Stand: Supervision, Min assist, From elevated  surface           General transfer comment: Min A for the first standing bout. verbal cues for hand placement to facilitate independence and safety with standing. supervision required for the second 2 bouts of standing with carry over of transfer techniques demonstrated. patient is able to maintain NWB of LLE with all functional mobility    Ambulation/Gait               General Gait Details: not assessed. patient is planning to use wheelchair at primary means of mobility at home   Stairs             Wheelchair Mobility    Modified Rankin (Stroke Patients Only)       Balance Overall balance assessment: Needs assistance Sitting-balance support: Feet supported Sitting balance-Leahy Scale: Good     Standing balance support: Bilateral upper extremity supported, Reliant on assistive device for balance Standing balance-Leahy Scale: Fair Standing balance comment: with UE supported on rolling walker. patient able to maintain NWB of LLE with all functional activity                            Cognition Arousal/Alertness: Awake/alert Behavior During Therapy: WFL for tasks assessed/performed Overall Cognitive Status: Within Functional Limits for tasks assessed                                          Exercises      General Comments        Pertinent Vitals/Pain Pain  Assessment Pain Assessment: No/denies pain    Home Living                          Prior Function            PT Goals (current goals can now be found in the care plan section) Acute Rehab PT Goals Patient Stated Goal: to go home and be able to do his own dressing changes PT Goal Formulation: With patient Time For Goal Achievement: 08/26/22 Potential to Achieve Goals: Fair Progress towards PT goals: Progressing toward goals    Frequency    Min 2X/week      PT Plan Current plan remains appropriate    Co-evaluation              AM-PAC PT  "6 Clicks" Mobility   Outcome Measure  Help needed turning from your back to your side while in a flat bed without using bedrails?: None Help needed moving from lying on your back to sitting on the side of a flat bed without using bedrails?: None Help needed moving to and from a bed to a chair (including a wheelchair)?: A Little Help needed standing up from a chair using your arms (e.g., wheelchair or bedside chair)?: A Little Help needed to walk in hospital room?: A Little Help needed climbing 3-5 steps with a railing? : A Lot 6 Click Score: 19    End of Session   Activity Tolerance: Patient tolerated treatment well Patient left: in bed;with call bell/phone within reach;with bed alarm set (sitting on edge of bed per patient preference)   PT Visit Diagnosis: Unsteadiness on feet (R26.81);Other abnormalities of gait and mobility (R26.89)     Time: 2025-4270 PT Time Calculation (min) (ACUTE ONLY): 12 min  Charges:  $Therapeutic Activity: 8-22 mins                     Minna Merritts, PT, MPT   Percell Locus 08/13/2022, 11:04 AM

## 2022-08-13 NOTE — Discharge Instructions (Signed)
Dressing changes:  Okay to clean around the wound daily or every other day with dressing changes with soap and water.  Pat dry.  Paint wound with Betadine and cover with dry gauze dressing.

## 2022-08-13 NOTE — TOC Transition Note (Signed)
Transition of Care [facility]) - CM/SW Discharge Note   Patient Details  Name: Chris Kline MRN: [id number] Date of Birth: Mar 11, 1966  Transition of Care [facility]) CM/SW Contact:  Candie Chroman, LCSW Phone Number: 08/13/2022, 8:59 AM   Clinical Narrative:  Patient has orders to discharge home today. No further concerns. CSW signing off.   Final next level of care: Home/Self Care Barriers to Discharge: Barriers Resolved   Patient Goals and CMS Choice        Discharge Placement                Patient to be transferred to facility by: Niece or nephew   Patient and family notified of of transfer: 08/13/22  Discharge Plan and Services                                     Social Determinants of Health (SDOH) Interventions     Readmission Risk Interventions     No data to display

## 2022-08-13 NOTE — Discharge Summary (Signed)
Physician Discharge Summary   Patient: Chris Kline MRN: [id number] DOB: July 07, 1966  Admit date:     08/10/2022  Discharge date: 08/13/22  Discharge Physician: Patrecia Pour   PCP: Sallee Lange, NP   Recommendations at discharge:  Follow up with podiatry in 1 week for wound recheck. Pt to dress wound himself, continue oral antibiotics, doxycycline for 7 more days.  Discharge Diagnoses: Principal Problem:   Sepsis ([place]) Active Problems:   Diabetic ulcer of left foot ([place])   HTN (hypertension)   AKI (acute kidney injury) ([place])   Diabetes mellitus, type 2 ([place])   Diabetic foot ulcer [facility])  Hospital Course: Chris Kline is a 56 y.o. male with a history of T2DM, left diabetic foot ulcer followed by podiatry who presented to the ED on 9/25 with a 2-week history of pain and swelling under the left great toe associated with drainage from an ulcerated area below the toe.  He also had subjective fever at home and decreased appetite. Temperature 99.4 with pulse 95 and otherwise normal vitals.  CBC within normal limits but lactic acid 3.6.  Creatinine 2.10 up from baseline of 1.09 a year ago.  X-ray of the foot showed soft tissue swelling dorsal aspect of the first MTP joint. ABIs normal, MRI showed a fluid collection but no osteomyelitis. Podiatry recommended admission for IV antibiotics and Dr. Vickki Muff performed I&D 9/26. Postoperative course has been uncomplicated and he is stable for discharge on 9/28 per podiatry and medical team.   Assessment and Plan: Sepsis due to diabetic left foot ulcer with fluid collection: s/p I&D 9/26 by Dr. Vickki Muff, podiatry. ABIs 1.1 bilaterally. No osteomyelitis on MRI.  - Follow up wound culture (remains without growth, gram stain negative, blood cultures NGTD rechecked on day of discharge), and surgical pathology results (pending at discharge).  - Wound care per podiatry. Pt will be given supplies and has received adequate education to perform his  own wound care.  - Culture growing S. aureus NOS at this time.  - WBC normal, afebrile, sepsis physiology resolved. Will empirically continue doxycycline for antimicrobial therapy after discussion with podiatry. Can also use probiotic while taking this if patient wishes.  - Pain has been adequately controlled with tylenol alone.   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  - Continue SSI  - Holding metformin with lactic acid elevation, AKI.    AKI: SCr 2.1 on arrival, improved to 1.25.  - Consider repeat BMP at follow up.   HTN:  - ACEi, HCTZ on hold with AKI. Last BP 134/81, can restart home medications at discharge.    Lactic acidosis: Improved - Held metformin, can restart after discharge.   Pancytopenia: ?In response to infection and surgery. Has resolved  - Monitor CBC at follow up is recommended.    Obesity: Estimated body mass index is 36.63 kg/m   Consultants: Podiatry Procedures performed: I&D 9/26 Dr. Vickki Muff  Disposition: Home Diet recommendation:  Cardiac and Carb modified diet DISCHARGE MEDICATION: Allergies as of 08/13/2022       Reactions   Erythromycin Other (See Comments)   Abdominal pain        Medication List     TAKE these medications    acetaminophen 325 MG tablet Commonly known as: TYLENOL Take 650 mg by mouth every 6 (six) hours as needed.   doxycycline 100 MG capsule Commonly known as: VIBRAMYCIN Take 1 capsule (100 mg total) by mouth 2 (two) times daily.   lisinopril-hydrochlorothiazide 20-25 MG tablet Commonly known  as: ZESTORETIC Take 1 tablet by mouth daily.   metFORMIN 500 MG tablet Commonly known as: GLUCOPHAGE Take 2 tablets (1,000 mg total) by mouth 2 (two) times daily with a meal.               Discharge Care Instructions  (From admission, onward)           Start     Ordered   08/13/22 0000  Discharge wound care:       Comments: As directed by podiatry   08/13/22 1303            Follow-up Information     Samara Deist,  DPM Follow up in 1 week(s).   Specialty: Podiatry Why: Can follow-up in [place] or [place] location. Contact information: [place] [address] [phone]         Gauger, Victoriano Lain, NP Follow up.   Specialty: Internal Medicine Contact information: [place] [address] [phone]                Discharge Exam: BP (!) 157/91 (BP Location: Left Arm)   Pulse 64   Temp 97.9 F (36.6 C) (Oral)   Resp 18   Ht 6' (1.829 m)   Wt 122.5 kg   SpO2 99%   BMI 36.63 kg/m   Nontoxic, pleasant male in no distress RRR, no MRG Clear, nonlabored Soft, NT, ND +BS Left foot dressing is c/d/I, intact cap refill at toes, intact sensation and motor function throughout as well. Picture on pt's phone of wound shows well apposed wound edges with central opening and no purulence. No surrounding erythema.  Condition at discharge: stable  The results of significant diagnostics from this hospitalization (including imaging, microbiology, ancillary and laboratory) are listed below for reference.   Imaging Studies: DG MINI C-ARM IMAGE ONLY  Result Date: 08/11/2022 There is no interpretation for this exam.  This order is for images obtained during a surgical procedure.  Please See "Surgeries" Tab for more information regarding the procedure.   US ARTERIAL ABI (SCREENING LOWER EXTREMITY)  Result Date: 08/11/2022 CLINICAL DATA:  Cellulitis, peripheral vascular disease EXAM: NONINVASIVE PHYSIOLOGIC VASCULAR STUDY OF BILATERAL LOWER EXTREMITIES TECHNIQUE: Evaluation of both lower extremities were performed at rest, including calculation of ankle-brachial indices with single level Doppler, pressure and pulse volume recording. COMPARISON:  None Available. FINDINGS: Right ABI:  1.16 Left ABI:  1.14 Right Lower Extremity:  Multiphasic arterial waveforms at the ankle. Left Lower Extremity:  Multiphasic arterial waveforms at the ankle. IMPRESSION: No evidence of  hemodynamically significant lower extremity arterial occlusive disease at rest. Electronically Signed   By: Lucrezia Europe M.D.   On: 08/11/2022 15:37   MR FOOT LEFT WO CONTRAST  Result Date: 08/11/2022 CLINICAL DATA:  Acute foot pain.  Limping.  Ulceration. EXAM: MRI OF THE LEFT FOOT WITHOUT CONTRAST TECHNIQUE: Multiplanar, multisequence MR imaging of the left foot was performed. No intravenous contrast was administered. COMPARISON:  08/27/2020 and radiographs 08/10/2022 FINDINGS: Despite efforts by the technologist and patient, motion artifact is present on today's exam and could not be eliminated. This reduces exam sensitivity and specificity. Bones/Joint/Cartilage No osseous marrow edema to indicate osteomyelitis. Ligaments Lisfranc ligament appears grossly intact. Muscles and Tendons Low-level edema along the flexor digitorum brevis and flexor digitorum longus, for example on image 41 series 12, cannot exclude myositis although some of the appearance may be neurogenic. Soft tissues Plantar ulceration below the first MTP joint. Mildly complex cystic  collection in this vicinity about 1.1 by 3.0 by 3.3 cm (volume = 5.7 cm^3). IMPRESSION: 1. Ulceration with underlying cystic collection along the medial plantar foot below the first MTP joint. This extends to the margin of the first digit sesamoids, but no marrow edema in the sesamoids are adjacent bony structures to suggest active osteomyelitis at this time. 2. There is some edema tracking along the flexor musculature in the foot. This is low-grade in could reflect mild myositis or neurogenic edema. 3. Despite efforts by the technologist and patient, motion artifact is present on today's exam and could not be eliminated. This reduces exam sensitivity and specificity. Electronically Signed   By: Van Clines M.D.   On: 08/11/2022 08:00   DG Foot Complete Left  Result Date: 08/10/2022 CLINICAL DATA:  Foot ulceration/infection. EXAM: LEFT FOOT - COMPLETE 3+  VIEW COMPARISON:  None Available. FINDINGS: There is no evidence of fracture or dislocation. There is no cortical erosion or periosteal reaction to suggest osteomyelitis. Achilles enthesopathy. Soft tissue swelling about dorsum of the foot about the first metatarsophalangeal joint. IMPRESSION: No cortical erosion or periosteal reaction to suggest osteomyelitis. Soft tissue swelling about dorsal aspect of the first metatarsophalangeal joint, MRI examination could be considered for further evaluation if clinically warranted. Electronically Signed   By: Keane Police D.O.   On: 08/10/2022 20:39    Microbiology: Results for orders placed or performed during the hospital encounter of 08/10/22  Blood culture (single)     Status: None (Preliminary result)   Collection Time: 08/10/22  8:11 PM   Specimen: BLOOD  Result Value Ref Range Status   Specimen Description BLOOD RIGHT ANTECUBITAL  Final   Special Requests   Final    BOTTLES DRAWN AEROBIC AND ANAEROBIC Blood Culture results may not be optimal due to an excessive volume of blood received in culture bottles   Culture   Final    NO GROWTH 3 DAYS Performed at [facility], [address] [place], [place] [address]    Report Status PENDING  Incomplete  Blood culture (routine x 2)     Status: None (Preliminary result)   Collection Time: 08/10/22  9:25 PM   Specimen: BLOOD  Result Value Ref Range Status   Specimen Description BLOOD RIGHT HAND  Final   Special Requests   Final    BOTTLES DRAWN AEROBIC AND ANAEROBIC Blood Culture adequate volume   Culture   Final    NO GROWTH 3 DAYS Performed at [facility], [address] [place], [place] [address]    Report Status PENDING  Incomplete  Blood culture (routine x 2)     Status: None (Preliminary result)   Collection Time: 08/10/22  9:40 PM   Specimen: BLOOD  Result Value Ref Range Status   Specimen Description BLOOD LEFT ASSIST CONTROL  Final   Special Requests   Final     BOTTLES DRAWN AEROBIC ONLY Blood Culture adequate volume   Culture   Final    NO GROWTH 3 DAYS Performed at [facility], [address], [place], [place] [address]    Report Status PENDING  Incomplete  Surgical PCR screen     Status: Abnormal   Collection Time: 08/11/22 12:41 PM   Specimen: Nasal Mucosa; Nasal Swab  Result Value Ref Range Status   MRSA, PCR NEGATIVE NEGATIVE Final   Staphylococcus aureus POSITIVE (A) NEGATIVE Final    Comment: (NOTE) The Xpert SA Assay (FDA approved for NASAL specimens in  patients 64 years of age and older), is one component of a comprehensive surveillance program. It is not intended to diagnose infection nor to guide or monitor treatment. Performed at [facility], [address], [place], [place] [address]   Aerobic/Anaerobic Culture w Gram Stain (surgical/deep wound)     Status: None (Preliminary result)   Collection Time: 08/11/22  5:37 PM   Specimen: PATH Other; Tissue  Result Value Ref Range Status   Specimen Description   Final    TOE Performed at [facility], [address] [place], [place] [address]    Special Requests   Final    LEFT INFECTED TOE Performed at [facility], [address] [place], [place] [address]    Gram Stain NO WBC SEEN NO ORGANISMS SEEN   Final   Culture   Final    RARE STAPHYLOCOCCUS AUREUS CULTURE REINCUBATED FOR BETTER GROWTH HOLDING FOR POSSIBLE ANAEROBE Performed at [facility], [place] [address], [place], [place] [address]    Report Status PENDING  Incomplete    Labs: CBC: Recent Labs  Lab 08/10/22 2010 08/12/22 0710 08/13/22 0410  WBC 6.4 3.5* 5.0  NEUTROABS 4.3  --   --   HGB 14.1 12.0* 11.5*  HCT 43.1 35.4* 35.3*  MCV 86.2 85.1 86.7  PLT 200 149* 341   Basic Metabolic Panel: Recent Labs  Lab 08/10/22 2010 08/11/22 1135 08/12/22 0710 08/13/22 0410  NA 134* 134* 135 137  K 5.1 5.0 4.9 4.3  CL 97* 103 106 106  CO2 22 19* 20* 26   GLUCOSE 136* 120* 173* 127*  BUN 43* 34* 27* 25*  CREATININE 2.10* 1.56* 1.30* 1.25*  CALCIUM 9.5 8.7* 8.2* 8.3*  PHOS  --  3.2  --   --    Liver Function Tests: Recent Labs  Lab 08/10/22 2010 08/11/22 1135  AST 44*  --   ALT 34  --   ALKPHOS 48  --   BILITOT 0.7  --   PROT 8.3*  --   ALBUMIN 4.4 3.9   CBG: Recent Labs  Lab 08/12/22 1143 08/12/22 1712 08/12/22 2004 08/13/22 0801 08/13/22 1131  GLUCAP 136* 153* 208* 93 120*    Discharge time spent: greater than 30 minutes.  Signed: Patrecia Pour, MD [facility] 08/13/2022

## 2022-08-15 LAB — CULTURE, BLOOD (ROUTINE X 2)
Culture: NO GROWTH
Culture: NO GROWTH
Special Requests: ADEQUATE
Special Requests: ADEQUATE

## 2022-08-15 LAB — CULTURE, BLOOD (SINGLE): Culture: NO GROWTH

## 2022-08-16 LAB — AEROBIC/ANAEROBIC CULTURE W GRAM STAIN (SURGICAL/DEEP WOUND): Gram Stain: NONE SEEN

## 2022-08-18 DIAGNOSIS — M2042 Other hammer toe(s) (acquired), left foot: Secondary | ICD-10-CM | POA: Diagnosis not present

## 2022-08-18 DIAGNOSIS — M216X2 Other acquired deformities of left foot: Secondary | ICD-10-CM | POA: Diagnosis not present

## 2022-08-18 DIAGNOSIS — M71072 Abscess of bursa, left ankle and foot: Secondary | ICD-10-CM | POA: Diagnosis not present

## 2022-08-18 DIAGNOSIS — M216X1 Other acquired deformities of right foot: Secondary | ICD-10-CM | POA: Diagnosis not present

## 2022-08-18 DIAGNOSIS — L97509 Non-pressure chronic ulcer of other part of unspecified foot with unspecified severity: Secondary | ICD-10-CM | POA: Diagnosis not present

## 2022-08-18 DIAGNOSIS — M2041 Other hammer toe(s) (acquired), right foot: Secondary | ICD-10-CM | POA: Diagnosis not present

## 2022-08-18 DIAGNOSIS — E11621 Type 2 diabetes mellitus with foot ulcer: Secondary | ICD-10-CM | POA: Diagnosis not present

## 2022-08-25 DIAGNOSIS — L97521 Non-pressure chronic ulcer of other part of left foot limited to breakdown of skin: Secondary | ICD-10-CM | POA: Diagnosis not present

## 2022-08-25 DIAGNOSIS — L97509 Non-pressure chronic ulcer of other part of unspecified foot with unspecified severity: Secondary | ICD-10-CM | POA: Diagnosis not present

## 2022-08-25 DIAGNOSIS — E11621 Type 2 diabetes mellitus with foot ulcer: Secondary | ICD-10-CM | POA: Diagnosis not present

## 2022-08-25 DIAGNOSIS — M71072 Abscess of bursa, left ankle and foot: Secondary | ICD-10-CM | POA: Diagnosis not present

## 2022-10-13 DIAGNOSIS — E11621 Type 2 diabetes mellitus with foot ulcer: Secondary | ICD-10-CM | POA: Diagnosis not present

## 2022-10-13 DIAGNOSIS — M71072 Abscess of bursa, left ankle and foot: Secondary | ICD-10-CM | POA: Diagnosis not present

## 2022-10-13 DIAGNOSIS — L97521 Non-pressure chronic ulcer of other part of left foot limited to breakdown of skin: Secondary | ICD-10-CM | POA: Diagnosis not present

## 2022-11-24 DIAGNOSIS — E11621 Type 2 diabetes mellitus with foot ulcer: Secondary | ICD-10-CM | POA: Diagnosis not present

## 2022-11-24 DIAGNOSIS — L97521 Non-pressure chronic ulcer of other part of left foot limited to breakdown of skin: Secondary | ICD-10-CM | POA: Diagnosis not present

## 2022-11-24 DIAGNOSIS — M71072 Abscess of bursa, left ankle and foot: Secondary | ICD-10-CM | POA: Diagnosis not present

## 2023-01-01 DIAGNOSIS — Z6835 Body mass index (BMI) 35.0-35.9, adult: Secondary | ICD-10-CM | POA: Diagnosis not present

## 2023-01-01 DIAGNOSIS — E1169 Type 2 diabetes mellitus with other specified complication: Secondary | ICD-10-CM | POA: Diagnosis not present

## 2023-01-01 DIAGNOSIS — Z79899 Other long term (current) drug therapy: Secondary | ICD-10-CM | POA: Diagnosis not present

## 2023-01-01 DIAGNOSIS — E1142 Type 2 diabetes mellitus with diabetic polyneuropathy: Secondary | ICD-10-CM | POA: Diagnosis not present

## 2023-01-01 DIAGNOSIS — I10 Essential (primary) hypertension: Secondary | ICD-10-CM | POA: Diagnosis not present

## 2023-01-01 DIAGNOSIS — Z5320 Procedure and treatment not carried out because of patient's decision for unspecified reasons: Secondary | ICD-10-CM | POA: Diagnosis not present

## 2023-01-01 DIAGNOSIS — N1831 Chronic kidney disease, stage 3a: Secondary | ICD-10-CM | POA: Diagnosis not present

## 2023-01-01 DIAGNOSIS — Z0000 Encounter for general adult medical examination without abnormal findings: Secondary | ICD-10-CM | POA: Diagnosis not present

## 2023-01-01 DIAGNOSIS — E785 Hyperlipidemia, unspecified: Secondary | ICD-10-CM | POA: Diagnosis not present

## 2023-02-09 DIAGNOSIS — L97521 Non-pressure chronic ulcer of other part of left foot limited to breakdown of skin: Secondary | ICD-10-CM | POA: Diagnosis not present

## 2023-02-09 DIAGNOSIS — E11621 Type 2 diabetes mellitus with foot ulcer: Secondary | ICD-10-CM | POA: Diagnosis not present

## 2023-02-09 DIAGNOSIS — M216X1 Other acquired deformities of right foot: Secondary | ICD-10-CM | POA: Diagnosis not present

## 2023-02-09 DIAGNOSIS — M216X2 Other acquired deformities of left foot: Secondary | ICD-10-CM | POA: Diagnosis not present

## 2023-02-23 DIAGNOSIS — L97521 Non-pressure chronic ulcer of other part of left foot limited to breakdown of skin: Secondary | ICD-10-CM | POA: Diagnosis not present

## 2023-02-23 DIAGNOSIS — E11621 Type 2 diabetes mellitus with foot ulcer: Secondary | ICD-10-CM | POA: Diagnosis not present

## 2023-05-11 DIAGNOSIS — L97521 Non-pressure chronic ulcer of other part of left foot limited to breakdown of skin: Secondary | ICD-10-CM | POA: Diagnosis not present

## 2023-05-11 DIAGNOSIS — E11621 Type 2 diabetes mellitus with foot ulcer: Secondary | ICD-10-CM | POA: Diagnosis not present

## 2023-06-22 DIAGNOSIS — M216X1 Other acquired deformities of right foot: Secondary | ICD-10-CM | POA: Diagnosis not present

## 2023-06-22 DIAGNOSIS — E11621 Type 2 diabetes mellitus with foot ulcer: Secondary | ICD-10-CM | POA: Diagnosis not present

## 2023-06-22 DIAGNOSIS — M216X2 Other acquired deformities of left foot: Secondary | ICD-10-CM | POA: Diagnosis not present

## 2023-06-22 DIAGNOSIS — L97521 Non-pressure chronic ulcer of other part of left foot limited to breakdown of skin: Secondary | ICD-10-CM | POA: Diagnosis not present

## 2023-07-05 DIAGNOSIS — E1169 Type 2 diabetes mellitus with other specified complication: Secondary | ICD-10-CM | POA: Diagnosis not present

## 2023-07-05 DIAGNOSIS — I10 Essential (primary) hypertension: Secondary | ICD-10-CM | POA: Diagnosis not present

## 2023-07-05 DIAGNOSIS — E785 Hyperlipidemia, unspecified: Secondary | ICD-10-CM | POA: Diagnosis not present

## 2023-07-05 DIAGNOSIS — Z79899 Other long term (current) drug therapy: Secondary | ICD-10-CM | POA: Diagnosis not present

## 2023-07-05 DIAGNOSIS — Z1331 Encounter for screening for depression: Secondary | ICD-10-CM | POA: Diagnosis not present

## 2023-07-05 DIAGNOSIS — E1142 Type 2 diabetes mellitus with diabetic polyneuropathy: Secondary | ICD-10-CM | POA: Diagnosis not present

## 2023-07-05 DIAGNOSIS — Z5320 Procedure and treatment not carried out because of patient's decision for unspecified reasons: Secondary | ICD-10-CM | POA: Diagnosis not present

## 2023-08-03 DIAGNOSIS — L97521 Non-pressure chronic ulcer of other part of left foot limited to breakdown of skin: Secondary | ICD-10-CM | POA: Diagnosis not present

## 2023-08-03 DIAGNOSIS — E11621 Type 2 diabetes mellitus with foot ulcer: Secondary | ICD-10-CM | POA: Diagnosis not present

## 2023-08-03 DIAGNOSIS — M216X2 Other acquired deformities of left foot: Secondary | ICD-10-CM | POA: Diagnosis not present

## 2023-08-03 DIAGNOSIS — M216X1 Other acquired deformities of right foot: Secondary | ICD-10-CM | POA: Diagnosis not present

## 2023-09-02 DIAGNOSIS — E11621 Type 2 diabetes mellitus with foot ulcer: Secondary | ICD-10-CM | POA: Diagnosis not present

## 2023-09-02 DIAGNOSIS — M216X2 Other acquired deformities of left foot: Secondary | ICD-10-CM | POA: Diagnosis not present

## 2023-09-02 DIAGNOSIS — M216X1 Other acquired deformities of right foot: Secondary | ICD-10-CM | POA: Diagnosis not present

## 2023-09-02 DIAGNOSIS — L97521 Non-pressure chronic ulcer of other part of left foot limited to breakdown of skin: Secondary | ICD-10-CM | POA: Diagnosis not present

## 2023-09-16 DIAGNOSIS — M216X2 Other acquired deformities of left foot: Secondary | ICD-10-CM | POA: Diagnosis not present

## 2023-09-16 DIAGNOSIS — M216X1 Other acquired deformities of right foot: Secondary | ICD-10-CM | POA: Diagnosis not present

## 2023-09-16 DIAGNOSIS — L97521 Non-pressure chronic ulcer of other part of left foot limited to breakdown of skin: Secondary | ICD-10-CM | POA: Diagnosis not present

## 2023-09-16 DIAGNOSIS — L97509 Non-pressure chronic ulcer of other part of unspecified foot with unspecified severity: Secondary | ICD-10-CM | POA: Diagnosis not present

## 2023-09-16 DIAGNOSIS — E11621 Type 2 diabetes mellitus with foot ulcer: Secondary | ICD-10-CM | POA: Diagnosis not present

## 2023-10-07 DIAGNOSIS — M216X1 Other acquired deformities of right foot: Secondary | ICD-10-CM | POA: Diagnosis not present

## 2023-10-07 DIAGNOSIS — M216X2 Other acquired deformities of left foot: Secondary | ICD-10-CM | POA: Diagnosis not present

## 2023-10-07 DIAGNOSIS — L97521 Non-pressure chronic ulcer of other part of left foot limited to breakdown of skin: Secondary | ICD-10-CM | POA: Diagnosis not present

## 2023-10-07 DIAGNOSIS — M2032 Hallux varus (acquired), left foot: Secondary | ICD-10-CM | POA: Diagnosis not present

## 2023-10-26 DIAGNOSIS — M216X1 Other acquired deformities of right foot: Secondary | ICD-10-CM | POA: Diagnosis not present

## 2023-10-26 DIAGNOSIS — L97521 Non-pressure chronic ulcer of other part of left foot limited to breakdown of skin: Secondary | ICD-10-CM | POA: Diagnosis not present

## 2023-10-26 DIAGNOSIS — E11621 Type 2 diabetes mellitus with foot ulcer: Secondary | ICD-10-CM | POA: Diagnosis not present

## 2023-10-26 DIAGNOSIS — M216X2 Other acquired deformities of left foot: Secondary | ICD-10-CM | POA: Diagnosis not present

## 2023-11-30 DIAGNOSIS — M216X2 Other acquired deformities of left foot: Secondary | ICD-10-CM | POA: Diagnosis not present

## 2023-11-30 DIAGNOSIS — L97509 Non-pressure chronic ulcer of other part of unspecified foot with unspecified severity: Secondary | ICD-10-CM | POA: Diagnosis not present

## 2023-11-30 DIAGNOSIS — E11621 Type 2 diabetes mellitus with foot ulcer: Secondary | ICD-10-CM | POA: Diagnosis not present

## 2023-11-30 DIAGNOSIS — L97521 Non-pressure chronic ulcer of other part of left foot limited to breakdown of skin: Secondary | ICD-10-CM | POA: Diagnosis not present

## 2023-11-30 DIAGNOSIS — M216X1 Other acquired deformities of right foot: Secondary | ICD-10-CM | POA: Diagnosis not present

## 2023-11-30 DIAGNOSIS — M2032 Hallux varus (acquired), left foot: Secondary | ICD-10-CM | POA: Diagnosis not present

## 2023-12-21 DIAGNOSIS — E11621 Type 2 diabetes mellitus with foot ulcer: Secondary | ICD-10-CM | POA: Diagnosis not present

## 2023-12-21 DIAGNOSIS — M216X2 Other acquired deformities of left foot: Secondary | ICD-10-CM | POA: Diagnosis not present

## 2023-12-21 DIAGNOSIS — L97521 Non-pressure chronic ulcer of other part of left foot limited to breakdown of skin: Secondary | ICD-10-CM | POA: Diagnosis not present

## 2023-12-21 DIAGNOSIS — M216X1 Other acquired deformities of right foot: Secondary | ICD-10-CM | POA: Diagnosis not present

## 2024-01-07 DIAGNOSIS — E785 Hyperlipidemia, unspecified: Secondary | ICD-10-CM | POA: Diagnosis not present

## 2024-01-07 DIAGNOSIS — E1169 Type 2 diabetes mellitus with other specified complication: Secondary | ICD-10-CM | POA: Diagnosis not present

## 2024-01-07 DIAGNOSIS — E1100 Type 2 diabetes mellitus with hyperosmolarity without nonketotic hyperglycemic-hyperosmolar coma (NKHHC): Secondary | ICD-10-CM | POA: Diagnosis not present

## 2024-01-07 DIAGNOSIS — Z0000 Encounter for general adult medical examination without abnormal findings: Secondary | ICD-10-CM | POA: Diagnosis not present

## 2024-01-07 DIAGNOSIS — R972 Elevated prostate specific antigen [PSA]: Secondary | ICD-10-CM | POA: Diagnosis not present

## 2024-01-18 DIAGNOSIS — L97521 Non-pressure chronic ulcer of other part of left foot limited to breakdown of skin: Secondary | ICD-10-CM | POA: Diagnosis not present

## 2024-01-18 DIAGNOSIS — E11621 Type 2 diabetes mellitus with foot ulcer: Secondary | ICD-10-CM | POA: Diagnosis not present

## 2024-01-18 DIAGNOSIS — M216X1 Other acquired deformities of right foot: Secondary | ICD-10-CM | POA: Diagnosis not present

## 2024-01-18 DIAGNOSIS — M216X2 Other acquired deformities of left foot: Secondary | ICD-10-CM | POA: Diagnosis not present

## 2024-02-08 DIAGNOSIS — M216X2 Other acquired deformities of left foot: Secondary | ICD-10-CM | POA: Diagnosis not present

## 2024-02-08 DIAGNOSIS — E11621 Type 2 diabetes mellitus with foot ulcer: Secondary | ICD-10-CM | POA: Diagnosis not present

## 2024-02-08 DIAGNOSIS — L97521 Non-pressure chronic ulcer of other part of left foot limited to breakdown of skin: Secondary | ICD-10-CM | POA: Diagnosis not present

## 2024-02-08 DIAGNOSIS — M216X1 Other acquired deformities of right foot: Secondary | ICD-10-CM | POA: Diagnosis not present

## 2024-02-29 DIAGNOSIS — M216X2 Other acquired deformities of left foot: Secondary | ICD-10-CM | POA: Diagnosis not present

## 2024-02-29 DIAGNOSIS — M216X1 Other acquired deformities of right foot: Secondary | ICD-10-CM | POA: Diagnosis not present

## 2024-02-29 DIAGNOSIS — E11621 Type 2 diabetes mellitus with foot ulcer: Secondary | ICD-10-CM | POA: Diagnosis not present

## 2024-02-29 DIAGNOSIS — L97521 Non-pressure chronic ulcer of other part of left foot limited to breakdown of skin: Secondary | ICD-10-CM | POA: Diagnosis not present

## 2024-03-30 DIAGNOSIS — L97521 Non-pressure chronic ulcer of other part of left foot limited to breakdown of skin: Secondary | ICD-10-CM | POA: Diagnosis not present

## 2024-03-30 DIAGNOSIS — E11621 Type 2 diabetes mellitus with foot ulcer: Secondary | ICD-10-CM | POA: Diagnosis not present

## 2024-04-20 DIAGNOSIS — M216X2 Other acquired deformities of left foot: Secondary | ICD-10-CM | POA: Diagnosis not present

## 2024-04-20 DIAGNOSIS — M216X1 Other acquired deformities of right foot: Secondary | ICD-10-CM | POA: Diagnosis not present

## 2024-04-20 DIAGNOSIS — L97521 Non-pressure chronic ulcer of other part of left foot limited to breakdown of skin: Secondary | ICD-10-CM | POA: Diagnosis not present

## 2024-04-20 DIAGNOSIS — E11621 Type 2 diabetes mellitus with foot ulcer: Secondary | ICD-10-CM | POA: Diagnosis not present

## 2024-05-11 DIAGNOSIS — M216X1 Other acquired deformities of right foot: Secondary | ICD-10-CM | POA: Diagnosis not present

## 2024-05-11 DIAGNOSIS — E11621 Type 2 diabetes mellitus with foot ulcer: Secondary | ICD-10-CM | POA: Diagnosis not present

## 2024-05-11 DIAGNOSIS — M216X2 Other acquired deformities of left foot: Secondary | ICD-10-CM | POA: Diagnosis not present

## 2024-05-11 DIAGNOSIS — L97521 Non-pressure chronic ulcer of other part of left foot limited to breakdown of skin: Secondary | ICD-10-CM | POA: Diagnosis not present

## 2024-06-01 DIAGNOSIS — L97509 Non-pressure chronic ulcer of other part of unspecified foot with unspecified severity: Secondary | ICD-10-CM | POA: Diagnosis not present

## 2024-06-01 DIAGNOSIS — M216X2 Other acquired deformities of left foot: Secondary | ICD-10-CM | POA: Diagnosis not present

## 2024-06-01 DIAGNOSIS — M216X1 Other acquired deformities of right foot: Secondary | ICD-10-CM | POA: Diagnosis not present

## 2024-06-01 DIAGNOSIS — E11621 Type 2 diabetes mellitus with foot ulcer: Secondary | ICD-10-CM | POA: Diagnosis not present

## 2024-06-01 DIAGNOSIS — L97521 Non-pressure chronic ulcer of other part of left foot limited to breakdown of skin: Secondary | ICD-10-CM | POA: Diagnosis not present

## 2024-06-22 DIAGNOSIS — L97521 Non-pressure chronic ulcer of other part of left foot limited to breakdown of skin: Secondary | ICD-10-CM | POA: Diagnosis not present

## 2024-06-22 DIAGNOSIS — M216X2 Other acquired deformities of left foot: Secondary | ICD-10-CM | POA: Diagnosis not present

## 2024-06-22 DIAGNOSIS — M216X1 Other acquired deformities of right foot: Secondary | ICD-10-CM | POA: Diagnosis not present

## 2024-06-22 DIAGNOSIS — E11621 Type 2 diabetes mellitus with foot ulcer: Secondary | ICD-10-CM | POA: Diagnosis not present

## 2024-06-22 DIAGNOSIS — L97509 Non-pressure chronic ulcer of other part of unspecified foot with unspecified severity: Secondary | ICD-10-CM | POA: Diagnosis not present

## 2024-07-06 DIAGNOSIS — L97528 Non-pressure chronic ulcer of other part of left foot with other specified severity: Secondary | ICD-10-CM | POA: Diagnosis not present

## 2024-07-06 DIAGNOSIS — E1100 Type 2 diabetes mellitus with hyperosmolarity without nonketotic hyperglycemic-hyperosmolar coma (NKHHC): Secondary | ICD-10-CM | POA: Diagnosis not present

## 2024-07-06 DIAGNOSIS — E11621 Type 2 diabetes mellitus with foot ulcer: Secondary | ICD-10-CM | POA: Diagnosis not present

## 2024-07-06 DIAGNOSIS — I10 Essential (primary) hypertension: Secondary | ICD-10-CM | POA: Diagnosis not present

## 2024-07-06 DIAGNOSIS — Z5320 Procedure and treatment not carried out because of patient's decision for unspecified reasons: Secondary | ICD-10-CM | POA: Diagnosis not present

## 2024-07-13 DIAGNOSIS — M216X2 Other acquired deformities of left foot: Secondary | ICD-10-CM | POA: Diagnosis not present

## 2024-07-13 DIAGNOSIS — M216X1 Other acquired deformities of right foot: Secondary | ICD-10-CM | POA: Diagnosis not present

## 2024-07-13 DIAGNOSIS — L97521 Non-pressure chronic ulcer of other part of left foot limited to breakdown of skin: Secondary | ICD-10-CM | POA: Diagnosis not present

## 2024-07-13 DIAGNOSIS — E11621 Type 2 diabetes mellitus with foot ulcer: Secondary | ICD-10-CM | POA: Diagnosis not present

## 2024-08-03 DIAGNOSIS — E11621 Type 2 diabetes mellitus with foot ulcer: Secondary | ICD-10-CM | POA: Diagnosis not present

## 2024-08-03 DIAGNOSIS — L97521 Non-pressure chronic ulcer of other part of left foot limited to breakdown of skin: Secondary | ICD-10-CM | POA: Diagnosis not present

## 2024-08-03 DIAGNOSIS — M216X2 Other acquired deformities of left foot: Secondary | ICD-10-CM | POA: Diagnosis not present

## 2024-08-03 DIAGNOSIS — M216X1 Other acquired deformities of right foot: Secondary | ICD-10-CM | POA: Diagnosis not present

## 2024-08-24 DIAGNOSIS — L97521 Non-pressure chronic ulcer of other part of left foot limited to breakdown of skin: Secondary | ICD-10-CM | POA: Diagnosis not present

## 2024-08-24 DIAGNOSIS — E11621 Type 2 diabetes mellitus with foot ulcer: Secondary | ICD-10-CM | POA: Diagnosis not present

## 2024-08-24 DIAGNOSIS — M2032 Hallux varus (acquired), left foot: Secondary | ICD-10-CM | POA: Diagnosis not present

## 2024-08-24 DIAGNOSIS — M216X1 Other acquired deformities of right foot: Secondary | ICD-10-CM | POA: Diagnosis not present

## 2024-08-24 DIAGNOSIS — M216X2 Other acquired deformities of left foot: Secondary | ICD-10-CM | POA: Diagnosis not present

## 2024-09-14 DIAGNOSIS — E11621 Type 2 diabetes mellitus with foot ulcer: Secondary | ICD-10-CM | POA: Diagnosis not present

## 2024-09-14 DIAGNOSIS — M216X1 Other acquired deformities of right foot: Secondary | ICD-10-CM | POA: Diagnosis not present

## 2024-09-14 DIAGNOSIS — L97521 Non-pressure chronic ulcer of other part of left foot limited to breakdown of skin: Secondary | ICD-10-CM | POA: Diagnosis not present

## 2024-09-14 DIAGNOSIS — M216X2 Other acquired deformities of left foot: Secondary | ICD-10-CM | POA: Diagnosis not present

## 2024-10-05 DIAGNOSIS — M216X2 Other acquired deformities of left foot: Secondary | ICD-10-CM | POA: Diagnosis not present

## 2024-10-05 DIAGNOSIS — L97521 Non-pressure chronic ulcer of other part of left foot limited to breakdown of skin: Secondary | ICD-10-CM | POA: Diagnosis not present

## 2024-10-05 DIAGNOSIS — E11621 Type 2 diabetes mellitus with foot ulcer: Secondary | ICD-10-CM | POA: Diagnosis not present

## 2024-10-05 DIAGNOSIS — M216X1 Other acquired deformities of right foot: Secondary | ICD-10-CM | POA: Diagnosis not present

## 2024-10-20 DIAGNOSIS — E1100 Type 2 diabetes mellitus with hyperosmolarity without nonketotic hyperglycemic-hyperosmolar coma (NKHHC): Secondary | ICD-10-CM | POA: Diagnosis not present

## 2024-10-27 DIAGNOSIS — E1100 Type 2 diabetes mellitus with hyperosmolarity without nonketotic hyperglycemic-hyperosmolar coma (NKHHC): Secondary | ICD-10-CM | POA: Diagnosis not present

## 2024-10-27 DIAGNOSIS — L97528 Non-pressure chronic ulcer of other part of left foot with other specified severity: Secondary | ICD-10-CM | POA: Diagnosis not present

## 2024-10-27 DIAGNOSIS — E785 Hyperlipidemia, unspecified: Secondary | ICD-10-CM | POA: Diagnosis not present

## 2024-10-27 DIAGNOSIS — E1169 Type 2 diabetes mellitus with other specified complication: Secondary | ICD-10-CM | POA: Diagnosis not present

## 2024-10-27 DIAGNOSIS — E11621 Type 2 diabetes mellitus with foot ulcer: Secondary | ICD-10-CM | POA: Diagnosis not present

## 2024-10-27 DIAGNOSIS — I10 Essential (primary) hypertension: Secondary | ICD-10-CM | POA: Diagnosis not present

## 2024-11-02 DIAGNOSIS — E11621 Type 2 diabetes mellitus with foot ulcer: Secondary | ICD-10-CM | POA: Diagnosis not present

## 2024-11-02 DIAGNOSIS — M216X1 Other acquired deformities of right foot: Secondary | ICD-10-CM | POA: Diagnosis not present

## 2024-11-02 DIAGNOSIS — L97529 Non-pressure chronic ulcer of other part of left foot with unspecified severity: Secondary | ICD-10-CM | POA: Diagnosis not present

## 2024-11-02 DIAGNOSIS — M216X2 Other acquired deformities of left foot: Secondary | ICD-10-CM | POA: Diagnosis not present

## 2024-11-08 ENCOUNTER — Ambulatory Visit: Payer: Self-pay | Admitting: Family Medicine

## 2024-11-08 ENCOUNTER — Ambulatory Visit

## 2024-11-08 ENCOUNTER — Ambulatory Visit
Admission: EM | Admit: 2024-11-08 | Discharge: 2024-11-08 | Disposition: A | Discharge status: Home / Self Care | Attending: Family Medicine | Admitting: Family Medicine

## 2024-11-08 DIAGNOSIS — J069 Acute upper respiratory infection, unspecified: Secondary | ICD-10-CM

## 2024-11-08 DIAGNOSIS — R051 Acute cough: Secondary | ICD-10-CM | POA: Diagnosis not present

## 2024-11-08 DIAGNOSIS — R058 Other specified cough: Secondary | ICD-10-CM | POA: Diagnosis not present

## 2024-11-08 MED ORDER — PREDNISONE 10 MG (21) PO TBPK: ORAL_TABLET | Freq: Every day | ORAL | 0 refills | Status: AC

## 2024-11-08 MED ORDER — ALBUTEROL SULFATE HFA 108 (90 BASE) MCG/ACT IN AERS: 2.0000 | INHALATION_SPRAY | RESPIRATORY_TRACT | 0 refills | Status: AC | PRN

## 2024-11-08 MED ORDER — PROMETHAZINE-DM 6.25-15 MG/5ML PO SYRP: 5.0000 mL | ORAL_SOLUTION | Freq: Four times a day (QID) | ORAL | 0 refills | Status: AC | PRN

## 2024-11-08 NOTE — Discharge Instructions (Addendum)
 Your chest xray did not show evidence of pneumonia though the radiologist has not yet read it. If they find something that I didn't, I will call you.    You most likely have a viral respiratory infection that will gradually improve over the next 7-10 days. Cough may last up to 3 weeks. If your were prescribed medication, stop by the pharmacy to pick them up.    You can take Tylenol  and/or Ibuprofen as needed for fever reduction and pain relief.    For cough: honey 1/2 to 1 teaspoon (you can dilute the honey in water or another fluid). Stop at the pharmacy to pick up your prescription cough medication.   You can use a humidifier for chest congestion and cough.  If you don't have a humidifier, you can sit in the bathroom with the hot shower running.   Use the albuterol  inhaler for wheezing and shortness of breath every 4 hours while awake and right before bed, as needed.    For congestion: take a daily anti-histamine like Zyrtec, Claritin, and a oral decongestant, such as pseudoephedrine.  You can also use Flonase 1-2 sprays in each nostril daily.     It is important to stay hydrated: drink plenty of fluids (water, gatorade/powerade/pedialyte, juices, or teas) to keep your throat moisturized and help further relieve irritation/discomfort.    Return or go to the Emergency Department if symptoms worsen or do not improve in the next few days

## 2024-11-08 NOTE — ED Triage Notes (Signed)
 Patient presents to UC for a cough x 1 week. Believes he had the flu last week. Treating cough with OTC flu cough med and allergy med this morning.  Denies fever.

## 2024-11-08 NOTE — ED Provider Notes (Signed)
 " [facility]    CSN: [id number] Arrival date & time: 11/08/24  9180      History   Chief Complaint Chief Complaint  Patient presents with   Cough    HPI DAXSON REFFETT is a 58 y.o. male.   HPI  History obtained from the patient. Ottie presents for cough, rhinorrhea, nasal congestion, for the past 2 days after going to a couple Christmas parties. He is not able to breathe laying in the bed. He has raspy breathing and can hear himself breathe.  He has to sit up in the bed. He went Thinks he had the flu last week as he was weak and cold. No known sick contacts. Has been taking over the counter cough and cold medications which helps some.  He was seen by his PCP on 10/27/24 and his lungs were clear.     Denies fever, vomiting, diarrhea, sore throat and headache. Endorses abdominal pain due to coughing. No history of asthma. Denies vaping and smoking.       Past Medical History:  Diagnosis Date   Diabetes mellitus without complication (HCC)    type   History of kidney stones    Hypertension    Nephrolithiasis    Vitamin D  deficiency     Patient Active Problem List   Diagnosis Date Noted   Diabetic foot ulcer (HCC) 08/10/2022   Hyperlipidemia due to type 2 diabetes mellitus (HCC) 09/24/2020   Obesity (BMI 30-39.9) 09/24/2020   Diabetic ulcer of left foot (HCC) 08/27/2020   Left foot infection 08/27/2020   Sepsis (HCC) 08/27/2020   HTN (hypertension) 08/27/2020   AKI (acute kidney injury) 08/27/2020   Diabetes mellitus, type 2 (HCC) 08/27/2020   Refusal of statin medication by patient 03/02/2019   Vitamin D  insufficiency 10/16/2017    Past Surgical History:  Procedure Laterality Date   COLONOSCOPY WITH PROPOFOL  N/A 02/02/2018   Procedure: COLONOSCOPY WITH PROPOFOL ;  Surgeon: Viktoria Lamar DASEN, MD;  Location: [facility] ENDOSCOPY;  Service: Endoscopy;  Laterality: N/A;   INCISION AND DRAINAGE Left 08/11/2022   Procedure: INCISION AND DRAINAGE OF FOOT  ABSCESS;  Surgeon: Ashley Soulier, DPM;  Location: [facility] ORS;  Service: Podiatry;  Laterality: Left;   IRRIGATION AND DEBRIDEMENT FOOT Left 08/28/2020   Procedure: IRRIGATION AND DEBRIDEMENT FOOT;  Surgeon: Ashley Soulier, DPM;  Location: [facility] ORS;  Service: Podiatry;  Laterality: Left;   NO PAST SURGERIES     WISDOM TOOTH EXTRACTION         Home Medications    Prior to Admission medications  Medication Sig Start Date End Date Taking? Authorizing Provider  albuterol  (VENTOLIN  HFA) 108 (90 Base) MCG/ACT inhaler Inhale 2 puffs into the lungs every 4 (four) hours as needed. 11/08/24  Yes Kura Bethards, DO  amLODipine (NORVASC) 2.5 MG tablet Take 2.5 mg by mouth. 10/27/24 10/27/25 Yes [provider]  fluticasone (FLONASE) 50 MCG/ACT nasal spray SPRAY 1 SPRAY INTO INTO EACH NOSTRIL TWICE A DAY AS NEEDED FOR RHINITIS 11/06/24  Yes [provider]  glimepiride (AMARYL) 1 MG tablet Take 1 mg by mouth. 10/27/24 10/27/25 Yes [provider]  glipiZIDE (GLUCOTROL) 5 MG tablet Take 5 mg by mouth daily. 08/30/24  Yes [provider]  predniSONE  (STERAPRED UNI-PAK 21 TAB) 10 MG (21) TBPK tablet Take by mouth daily. Take 6 tabs by mouth daily for 1, then 5 tabs for 1 day, then 4 tabs for 1 day, then 3 tabs for 1 day, then 2 tabs  for 1 day, then 1 tab for 1 day. 11/08/24  Yes Estrella Alcaraz, DO  promethazine -dextromethorphan (PROMETHAZINE -DM) 6.25-15 MG/5ML syrup Take 5 mLs by mouth 4 (four) times daily as needed. 11/08/24  Yes Mehek Grega, DO  acetaminophen  (TYLENOL ) 325 MG tablet Take 650 mg by mouth every 6 (six) hours as needed.    [provider]  doxycycline  (VIBRAMYCIN ) 100 MG capsule Take 1 capsule (100 mg total) by mouth 2 (two) times daily. 08/13/22   Bryn Bernardino NOVAK, MD  lisinopril -hydrochlorothiazide  (ZESTORETIC ) 20-25 MG tablet Take 1 tablet by mouth daily. 08/12/20   [provider]  metFORMIN  (GLUCOPHAGE ) 500 MG tablet Take 2 tablets  (1,000 mg total) by mouth 2 (two) times daily with a meal. 08/30/20 08/10/22  Shona Terry SAILOR, DO    Family History Family History  Problem Relation Age of Onset   Heart attack Mother    Diabetes Mellitus II Father    Stroke Father    Heart attack Father    Lung cancer Sister     Social History Social History[1]   Allergies   Erythromycin and Pioglitazone   Review of Systems Review of Systems: negative unless otherwise stated in HPI.      Physical Exam Triage Vital Signs ED Triage Vitals  Encounter Vitals Group     BP      Girls Systolic BP Percentile      Girls Diastolic BP Percentile      Boys Systolic BP Percentile      Boys Diastolic BP Percentile      Pulse      Resp      Temp      Temp src      SpO2      Weight      Height      Head Circumference      Peak Flow      Pain Score      Pain Loc      Pain Education      Exclude from Growth Chart    No data found.  Updated Vital Signs BP 129/84 (BP Location: Right Arm)   Pulse 77   Temp 99 F (37.2 C) (Oral)   Resp 18   Wt 114.3 kg   SpO2 96%   BMI 34.16 kg/m   Visual Acuity Right Eye Distance:   Left Eye Distance:   Bilateral Distance:    Right Eye Near:   Left Eye Near:    Bilateral Near:     Physical Exam GEN:     alert, non-toxic appearing male in no distress    HENT:  mucus membranes moist, no nasal discharge RESP:  no increased work of breathing, expiratory wheezing with coarse breath sounds bilaterally CVS:   regular rate and rhythm Skin:   warm and dry, no rash on visible skin    UC Treatments / Results  Labs (all labs ordered are listed, but only abnormal results are displayed) Labs Reviewed - No data to display  EKG   Radiology DG Chest 2 View Result Date: 11/08/2024 CLINICAL DATA:  Productive cough for 2 days EXAM: CHEST - 2 VIEW COMPARISON:  None Available. FINDINGS: The heart size and mediastinal contours are within normal limits. Both lungs are clear. The  visualized skeletal structures are unremarkable. IMPRESSION: No active cardiopulmonary disease. Electronically Signed   By: Lynwood Landy Raddle M.D.   On: 11/08/2024 09:42     Procedures Procedures (including critical care time)  Medications Ordered  in UC Medications - No data to display  Initial Impression / Assessment and Plan / UC Course  I have reviewed the triage vital signs and the nursing notes.  Pertinent labs & imaging results that were available during my care of the patient were reviewed by me and considered in my medical decision making (see chart for details).       Pt is a 58 y.o. male who presents for 2 days of respiratory symptoms. Jaron is afebrile here without recent antipyretics. Satting well on room air. Overall pt is non-toxic appearing, well hydrated, without respiratory distress. Pulmonary exam is remarkable for expiratory wheezing and coarse breath sounds bilaterally.  POC COVID and influenza panel declined.  Recommended chest x-ray to evaluate for pneumonia and he is agreeable.Chest xray personally reviewed by me without focal pneumonia, pleural effusion, cardiomegaly or pneumothorax. Patient aware the radiologist has not read his xray and is comfortable with the preliminary read by me. Will review radiologist read when available and call patient if a change in plan is warranted.  Pt agreeable to this plan prior to discharge.   Suspect viral respiratory illness. Discussed symptomatic treatment.  Promethazine  DM for cough.  Prednisone  taper and albuterol  for wheezing also prescribed.  Patient to monitor his blood pressure and blood sugar while taking prednisone .  Explained lack of efficacy of antibiotics in viral disease.  Typical duration of symptoms discussed.   Return and ED precautions given and voiced understanding. Discussed MDM, treatment plan and plan for follow-up with patient  who agrees with plan.   Radiologist impression reviewed.  Final Clinical  Impressions(s) / UC Diagnoses   Final diagnoses:  Acute cough  Viral URI with cough     Discharge Instructions      Your chest xray did not show evidence of pneumonia though the radiologist has not yet read it. If they find something that I didn't, I will call you.    You most likely have a viral respiratory infection that will gradually improve over the next 7-10 days. Cough may last up to 3 weeks. If your were prescribed medication, stop by the pharmacy to pick them up.    You can take Tylenol  and/or Ibuprofen as needed for fever reduction and pain relief.    For cough: honey 1/2 to 1 teaspoon (you can dilute the honey in water or another fluid). Stop at the pharmacy to pick up your prescription cough medication.   You can use a humidifier for chest congestion and cough.  If you don't have a humidifier, you can sit in the bathroom with the hot shower running.   Use the albuterol  inhaler for wheezing and shortness of breath every 4 hours while awake and right before bed, as needed.    For congestion: take a daily anti-histamine like Zyrtec, Claritin, and a oral decongestant, such as pseudoephedrine.  You can also use Flonase 1-2 sprays in each nostril daily.     It is important to stay hydrated: drink plenty of fluids (water, gatorade/powerade/pedialyte, juices, or teas) to keep your throat moisturized and help further relieve irritation/discomfort.    Return or go to the Emergency Department if symptoms worsen or do not improve in the next few days       ED Prescriptions     Medication Sig Dispense Auth. Provider   promethazine -dextromethorphan (PROMETHAZINE -DM) 6.25-15 MG/5ML syrup Take 5 mLs by mouth 4 (four) times daily as needed. 118 mL Amedeo Detweiler, DO   predniSONE  (STERAPRED UNI-PAK 21 TAB)  10 MG (21) TBPK tablet Take by mouth daily. Take 6 tabs by mouth daily for 1, then 5 tabs for 1 day, then 4 tabs for 1 day, then 3 tabs for 1 day, then 2 tabs for 1 day, then 1 tab  for 1 day. 21 tablet Jahel Wavra, DO   albuterol  (VENTOLIN  HFA) 108 (90 Base) MCG/ACT inhaler Inhale 2 puffs into the lungs every 4 (four) hours as needed. 6.7 g Iam Lipson, DO      PDMP not reviewed this encounter.     [1]  Social History Tobacco Use   Smoking status: Never   Smokeless tobacco: Never  Vaping Use   Vaping status: Never Used  Substance Use Topics   Alcohol use: No   Drug use: No     Shaunte Tuft, DO 11/08/24 0951  "
